# Patient Record
Sex: Male | Born: 1975 | Race: Black or African American | Hispanic: No | Marital: Married | Smoking: Former smoker
Health system: Southern US, Community
[De-identification: ages and names within clinical notes are randomized; demographics above are authoritative.]

## PROBLEM LIST (undated history)

## (undated) DIAGNOSIS — F32A Depression, unspecified: Secondary | ICD-10-CM

## (undated) DIAGNOSIS — K219 Gastro-esophageal reflux disease without esophagitis: Secondary | ICD-10-CM

## (undated) DIAGNOSIS — F419 Anxiety disorder, unspecified: Secondary | ICD-10-CM

## (undated) DIAGNOSIS — I10 Essential (primary) hypertension: Secondary | ICD-10-CM

## (undated) DIAGNOSIS — T1491XA Suicide attempt, initial encounter: Secondary | ICD-10-CM

## (undated) HISTORY — DX: Anxiety disorder, unspecified: F41.9

## (undated) HISTORY — DX: Depression, unspecified: F32.A

## (undated) HISTORY — DX: Gastro-esophageal reflux disease without esophagitis: K21.9

---

## 1999-05-15 ENCOUNTER — Emergency Department (HOSPITAL_COMMUNITY): Admission: EM | Admit: 1999-05-15 | Discharge: 1999-05-15 | Payer: Self-pay | Admitting: *Deleted

## 1999-10-29 ENCOUNTER — Emergency Department (HOSPITAL_COMMUNITY): Admission: EM | Admit: 1999-10-29 | Discharge: 1999-10-29 | Payer: Self-pay | Admitting: Emergency Medicine

## 2000-01-18 ENCOUNTER — Emergency Department (HOSPITAL_COMMUNITY): Admission: EM | Admit: 2000-01-18 | Discharge: 2000-01-19 | Payer: Self-pay | Admitting: Internal Medicine

## 2000-01-18 ENCOUNTER — Encounter: Payer: Self-pay | Admitting: Emergency Medicine

## 2000-06-22 ENCOUNTER — Emergency Department (HOSPITAL_COMMUNITY): Admission: EM | Admit: 2000-06-22 | Discharge: 2000-06-22 | Payer: Self-pay | Admitting: Emergency Medicine

## 2000-10-10 ENCOUNTER — Emergency Department (HOSPITAL_COMMUNITY): Admission: EM | Admit: 2000-10-10 | Discharge: 2000-10-10 | Payer: Self-pay | Admitting: Emergency Medicine

## 2001-01-10 ENCOUNTER — Emergency Department (HOSPITAL_COMMUNITY): Admission: EM | Admit: 2001-01-10 | Discharge: 2001-01-10 | Payer: Self-pay | Admitting: Emergency Medicine

## 2002-08-04 ENCOUNTER — Emergency Department (HOSPITAL_COMMUNITY): Admission: EM | Admit: 2002-08-04 | Discharge: 2002-08-04 | Payer: Self-pay | Admitting: Emergency Medicine

## 2002-08-04 ENCOUNTER — Encounter: Payer: Self-pay | Admitting: Emergency Medicine

## 2002-10-06 ENCOUNTER — Encounter: Payer: Self-pay | Admitting: Emergency Medicine

## 2002-10-06 ENCOUNTER — Emergency Department (HOSPITAL_COMMUNITY): Admission: EM | Admit: 2002-10-06 | Discharge: 2002-10-06 | Payer: Self-pay | Admitting: Emergency Medicine

## 2004-01-26 ENCOUNTER — Emergency Department (HOSPITAL_COMMUNITY): Admission: EM | Admit: 2004-01-26 | Discharge: 2004-01-26 | Payer: Self-pay | Admitting: *Deleted

## 2004-01-28 ENCOUNTER — Ambulatory Visit (HOSPITAL_COMMUNITY): Admission: RE | Admit: 2004-01-28 | Discharge: 2004-01-28 | Payer: Self-pay | Admitting: *Deleted

## 2004-01-30 ENCOUNTER — Emergency Department (HOSPITAL_COMMUNITY): Admission: EM | Admit: 2004-01-30 | Discharge: 2004-01-30 | Payer: Self-pay | Admitting: Emergency Medicine

## 2004-03-25 ENCOUNTER — Encounter: Admission: RE | Admit: 2004-03-25 | Discharge: 2004-03-25 | Payer: Self-pay | Admitting: Neurosurgery

## 2004-04-15 ENCOUNTER — Encounter: Admission: RE | Admit: 2004-04-15 | Discharge: 2004-04-15 | Payer: Self-pay | Admitting: Neurosurgery

## 2004-04-19 ENCOUNTER — Emergency Department (HOSPITAL_COMMUNITY): Admission: EM | Admit: 2004-04-19 | Discharge: 2004-04-19 | Payer: Self-pay | Admitting: Emergency Medicine

## 2006-03-14 ENCOUNTER — Emergency Department (HOSPITAL_COMMUNITY): Admission: EM | Admit: 2006-03-14 | Discharge: 2006-03-14 | Payer: Self-pay | Admitting: Emergency Medicine

## 2007-01-16 ENCOUNTER — Emergency Department (HOSPITAL_COMMUNITY): Admission: EM | Admit: 2007-01-16 | Discharge: 2007-01-16 | Payer: Self-pay | Admitting: *Deleted

## 2007-06-26 ENCOUNTER — Emergency Department (HOSPITAL_COMMUNITY): Admission: EM | Admit: 2007-06-26 | Discharge: 2007-06-26 | Payer: Self-pay | Admitting: Emergency Medicine

## 2008-04-20 ENCOUNTER — Emergency Department (HOSPITAL_COMMUNITY): Admission: EM | Admit: 2008-04-20 | Discharge: 2008-04-20 | Payer: Self-pay | Admitting: Emergency Medicine

## 2009-02-09 ENCOUNTER — Emergency Department (HOSPITAL_COMMUNITY): Admission: EM | Admit: 2009-02-09 | Discharge: 2009-02-09 | Payer: Self-pay | Admitting: Emergency Medicine

## 2009-02-21 ENCOUNTER — Emergency Department (HOSPITAL_COMMUNITY): Admission: EM | Admit: 2009-02-21 | Discharge: 2009-02-21 | Payer: Self-pay | Admitting: Emergency Medicine

## 2010-04-14 ENCOUNTER — Emergency Department (HOSPITAL_COMMUNITY)
Admission: EM | Admit: 2010-04-14 | Discharge: 2010-04-14 | Payer: Self-pay | Source: Home / Self Care | Admitting: Emergency Medicine

## 2010-04-17 ENCOUNTER — Emergency Department (HOSPITAL_COMMUNITY)
Admission: EM | Admit: 2010-04-17 | Discharge: 2010-04-17 | Payer: Self-pay | Source: Home / Self Care | Admitting: Emergency Medicine

## 2011-01-06 LAB — COMPREHENSIVE METABOLIC PANEL
ALT: 57 — ABNORMAL HIGH
AST: 30
Calcium: 9.6
GFR calc Af Amer: >60
Sodium: 140
Total Protein: 7.3

## 2011-01-06 LAB — RAPID URINE DRUG SCREEN, HOSP PERFORMED
Amphetamines: NOT DETECTED
Barbiturates: NOT DETECTED
Benzodiazepines: NOT DETECTED

## 2011-01-06 LAB — DIFFERENTIAL
Eosinophils Absolute: 0.3
Eosinophils Relative: 3
Lymphs Abs: 2.5
Monocytes Relative: 6

## 2011-01-06 LAB — URINALYSIS, ROUTINE W REFLEX MICROSCOPIC
Glucose, UA: NEGATIVE
Hgb urine dipstick: NEGATIVE
Ketones, ur: NEGATIVE
Protein, ur: NEGATIVE

## 2011-01-06 LAB — CBC
MCHC: 33.2
RBC: 4.96
RDW: 13

## 2012-06-23 ENCOUNTER — Emergency Department: Payer: Self-pay | Admitting: Emergency Medicine

## 2013-05-17 ENCOUNTER — Emergency Department: Payer: Self-pay | Admitting: Emergency Medicine

## 2013-06-10 ENCOUNTER — Emergency Department (HOSPITAL_COMMUNITY)
Admission: EM | Admit: 2013-06-10 | Discharge: 2013-06-10 | Disposition: A | Discharge status: Home / Self Care | Payer: Self-pay | Attending: Emergency Medicine | Admitting: Emergency Medicine

## 2013-06-10 ENCOUNTER — Encounter (HOSPITAL_COMMUNITY): Payer: Self-pay | Admitting: Emergency Medicine

## 2013-06-10 DIAGNOSIS — K089 Disorder of teeth and supporting structures, unspecified: Secondary | ICD-10-CM | POA: Insufficient documentation

## 2013-06-10 DIAGNOSIS — F17200 Nicotine dependence, unspecified, uncomplicated: Secondary | ICD-10-CM | POA: Insufficient documentation

## 2013-06-10 DIAGNOSIS — K0889 Other specified disorders of teeth and supporting structures: Secondary | ICD-10-CM

## 2013-06-10 DIAGNOSIS — G8921 Chronic pain due to trauma: Secondary | ICD-10-CM | POA: Insufficient documentation

## 2013-06-10 MED ORDER — OXYCODONE-ACETAMINOPHEN 5-325 MG PO TABS: 1.0000 | ORAL_TABLET | ORAL | Status: DC | PRN

## 2013-06-10 MED ORDER — OXYCODONE-ACETAMINOPHEN 5-325 MG PO TABS
2.0000 | ORAL_TABLET | Freq: Once | ORAL | Status: AC
  Administered 2013-06-10: 2 via ORAL
  Filled 2013-06-10: qty 2

## 2013-06-10 NOTE — ED Provider Notes (Signed)
Medical screening examination/treatment/procedure(s) were performed by non-physician practitioner and as supervising physician I was immediately available for consultation/collaboration.     Shaynah Hund, MD 06/10/13 1638 

## 2013-06-10 NOTE — Discharge Instructions (Signed)
Dental Pain °Toothache is pain in or around a tooth. It may get worse with chewing or with cold or heat.  °HOME CARE °· Your dentist may use a numbing medicine during treatment. If so, you may need to avoid eating until the medicine wears off. Ask your dentist about this. °· Only take medicine as told by your dentist or doctor. °· Avoid chewing food near the painful tooth until after all treatment is done. Ask your dentist about this. °GET HELP RIGHT AWAY IF:  °· The problem gets worse or new problems appear. °· You have a fever. °· There is redness and puffiness (swelling) of the face, jaw, or neck. °· You cannot open your mouth. °· There is pain in the jaw. °· There is very bad pain that is not helped by medicine. °MAKE SURE YOU:  °· Understand these instructions. °· Will watch your condition. °· Will get help right away if you are not doing well or get worse. °Document Released: 09/01/2007 Document Revised: 06/07/2011 Document Reviewed: 09/01/2007 °ExitCare® Patient Information ©2014 [employer]. ° °Dental Care and Dentist Visits °Dental care supports good overall health. Regular dental visits can also help you avoid dental pain, bleeding, infection, and other more serious health problems in the future. It is important to keep the mouth healthy because diseases in the teeth, gums, and other oral tissues can spread to other areas of the body. Some problems, such as diabetes, heart disease, and pre-term labor have been associated with poor oral health.  °See your dentist every 6 months. If you experience emergency problems such as a toothache or broken tooth, go to the dentist right away. If you see your dentist regularly, you may catch problems early. It is easier to be treated for problems in the early stages.  °WHAT TO EXPECT AT A DENTIST VISIT  °Your dentist will look for many common oral health problems and recommend proper treatment. At your regular dental visit, you can expect: °· Gentle cleaning of the  teeth and gums. This includes scraping and polishing. This helps to remove the sticky substance around the teeth and gums (plaque). Plaque forms in the mouth shortly after eating. Over time, plaque hardens on the teeth as tartar. If tartar is not removed regularly, it can cause problems. Cleaning also helps remove stains. °· Periodic X-rays. These pictures of the teeth and supporting bone will help your dentist assess the health of your teeth. °· Periodic fluoride treatments. Fluoride is a natural mineral shown to help strengthen teeth. Fluoride treatment involves applying a fluoride gel or varnish to the teeth. It is most commonly done in children. °· Examination of the mouth, tongue, jaws, teeth, and gums to look for any oral health problems, such as: °· Cavities (dental caries). This is decay on the tooth caused by plaque, sugar, and acid in the mouth. It is best to catch a cavity when it is small. °· Inflammation of the gums caused by plaque buildup (gingivitis). °· Problems with the mouth or malformed or misaligned teeth. °· Oral cancer or other diseases of the soft tissues or jaws.  °KEEP YOUR TEETH AND GUMS HEALTHY °For healthy teeth and gums, follow these general guidelines as well as your dentist's specific advice: °· Have your teeth professionally cleaned at the dentist every 6 months. °· Brush twice daily with a fluoride toothpaste. °· Floss your teeth daily.  °· Ask your dentist if you need fluoride supplements, treatments, or fluoride toothpaste. °· Eat a healthy diet. Reduce foods and drinks   with added sugar. °· Avoid smoking. °TREATMENT FOR ORAL HEALTH PROBLEMS °If you have oral health problems, treatment varies depending on the conditions present in your teeth and gums. °· Your caregiver will most likely recommend good oral hygiene at each visit. °· For cavities, gingivitis, or other oral health disease, your caregiver will perform a procedure to treat the problem. This is typically done at a  separate appointment. Sometimes your caregiver will refer you to another dental specialist for specific tooth problems or for surgery. °SEEK IMMEDIATE DENTAL CARE IF: °· You have pain, bleeding, or soreness in the gum, tooth, jaw, or mouth area. °· A permanent tooth becomes loose or separated from the gum socket. °· You experience a blow or injury to the mouth or jaw area. °Document Released: 11/25/2010 Document Revised: 06/07/2011 Document Reviewed: 11/25/2010 °ExitCare® Patient Information ©2014 [employer]. ° °

## 2013-06-10 NOTE — ED Notes (Signed)
Pt reports having multiple missing and loose teeth. Having pain to right side and now swelling to face. Airway intact.

## 2013-06-10 NOTE — ED Notes (Signed)
Onset 1 month upper right teeth pain.  Pt states upper right wisdom tooth and several moore teeth are loose and roots exposed.  [place] and was prescribed ABX and Vicodin.  Finished ABX 1 week ago.  Has been taking OTC meds with no relief.  Pain last night was unbearable.
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
## 2014-02-03 ENCOUNTER — Encounter (HOSPITAL_COMMUNITY): Payer: Self-pay | Admitting: Emergency Medicine

## 2014-02-03 ENCOUNTER — Emergency Department (HOSPITAL_COMMUNITY)
Admission: EM | Admit: 2014-02-03 | Discharge: 2014-02-04 | Disposition: A | Discharge status: Home / Self Care | Payer: Self-pay | Attending: Emergency Medicine | Admitting: Emergency Medicine

## 2014-02-03 DIAGNOSIS — R45851 Suicidal ideations: Secondary | ICD-10-CM

## 2014-02-03 DIAGNOSIS — F439 Reaction to severe stress, unspecified: Secondary | ICD-10-CM | POA: Diagnosis present

## 2014-02-03 DIAGNOSIS — Z720 Tobacco use: Secondary | ICD-10-CM | POA: Insufficient documentation

## 2014-02-03 DIAGNOSIS — F32A Depression, unspecified: Secondary | ICD-10-CM | POA: Diagnosis present

## 2014-02-03 DIAGNOSIS — F1410 Cocaine abuse, uncomplicated: Secondary | ICD-10-CM | POA: Diagnosis present

## 2014-02-03 DIAGNOSIS — F1210 Cannabis abuse, uncomplicated: Secondary | ICD-10-CM | POA: Insufficient documentation

## 2014-02-03 DIAGNOSIS — F329 Major depressive disorder, single episode, unspecified: Secondary | ICD-10-CM | POA: Diagnosis present

## 2014-02-03 DIAGNOSIS — F438 Other reactions to severe stress: Secondary | ICD-10-CM | POA: Insufficient documentation

## 2014-02-03 DIAGNOSIS — F1010 Alcohol abuse, uncomplicated: Secondary | ICD-10-CM | POA: Diagnosis present

## 2014-02-03 LAB — SALICYLATE LEVEL: Salicylate Lvl: <2 mg/dL — ABNORMAL LOW (ref 2.8–20.0)

## 2014-02-03 LAB — COMPREHENSIVE METABOLIC PANEL
ALK PHOS: 73 U/L (ref 39–117)
ALT: 48 U/L (ref 0–53)
ANION GAP: 18 — AB (ref 5–15)
AST: 38 U/L — ABNORMAL HIGH (ref 0–37)
Albumin: 4.7 g/dL (ref 3.5–5.2)
BUN: 11 mg/dL (ref 6–23)
CALCIUM: 10.4 mg/dL (ref 8.4–10.5)
CO2: 23 meq/L (ref 19–32)
Chloride: 99 mEq/L (ref 96–112)
Creatinine, Ser: 0.91 mg/dL (ref 0.50–1.35)
GLUCOSE: 87 mg/dL (ref 70–99)
POTASSIUM: 4 meq/L (ref 3.7–5.3)
SODIUM: 140 meq/L (ref 137–147)
Total Bilirubin: 0.3 mg/dL (ref 0.3–1.2)
Total Protein: 9.1 g/dL — ABNORMAL HIGH (ref 6.0–8.3)

## 2014-02-03 LAB — ACETAMINOPHEN LEVEL: Acetaminophen (Tylenol), Serum: <15 ug/mL (ref 10–30)

## 2014-02-03 LAB — RAPID URINE DRUG SCREEN, HOSP PERFORMED
AMPHETAMINES: NOT DETECTED
BARBITURATES: NOT DETECTED
Benzodiazepines: NOT DETECTED
Cocaine: POSITIVE — AB
Opiates: NOT DETECTED
TETRAHYDROCANNABINOL: POSITIVE — AB

## 2014-02-03 LAB — CBC
HEMATOCRIT: 44.6 % (ref 39.0–52.0)
HEMOGLOBIN: 14.9 g/dL (ref 13.0–17.0)
MCH: 29.2 pg (ref 26.0–34.0)
MCHC: 33.4 g/dL (ref 30.0–36.0)
MCV: 87.5 fL (ref 78.0–100.0)
Platelets: 262 10*3/uL (ref 150–400)
RBC: 5.1 MIL/uL (ref 4.22–5.81)
RDW: 13.3 % (ref 11.5–15.5)
WBC: 8.9 10*3/uL (ref 4.0–10.5)

## 2014-02-03 LAB — ETHANOL: Alcohol, Ethyl (B): 23 mg/dL — ABNORMAL HIGH (ref 0–11)

## 2014-02-03 MED ORDER — ACETAMINOPHEN 325 MG PO TABS: 650.0000 mg | ORAL_TABLET | ORAL | Status: DC | PRN

## 2014-02-03 MED ORDER — ALUM & MAG HYDROXIDE-SIMETH 200-200-20 MG/5ML PO SUSP: 30.0000 mL | ORAL | Status: DC | PRN

## 2014-02-03 MED ORDER — LORAZEPAM 1 MG PO TABS
1.0000 mg | ORAL_TABLET | Freq: Three times a day (TID) | ORAL | Status: DC | PRN
  Administered 2014-02-03: 1 mg via ORAL
  Filled 2014-02-03: qty 1

## 2014-02-03 MED ORDER — IBUPROFEN 200 MG PO TABS: 600.0000 mg | ORAL_TABLET | Freq: Three times a day (TID) | ORAL | Status: DC | PRN

## 2014-02-03 MED ORDER — TRAZODONE HCL 50 MG PO TABS
50.0000 mg | ORAL_TABLET | Freq: Every evening | ORAL | Status: DC | PRN
  Administered 2014-02-03: 50 mg via ORAL
  Filled 2014-02-03: qty 1

## 2014-02-03 NOTE — BH Assessment (Addendum)
Pt meets inpatient criteria, per Maryjean Mornharles Kober PA. TTS will seek placement for pt since [facility]. TTS informed Dr Nicanor AlconPalumbo of recommendations and she is in agreement. Informed RN of plan to seek placement. Informed pt of plan and he is in agreement.  Cyndie MullAnna Jager Koska, [facility] Triage Specialist

## 2014-02-03 NOTE — ED Notes (Signed)
Pt arrived to the ED with the complaint of suicidal ideations.  Pt states he has been having several different issues of concern in his life and feels as if there is no reason left.  Pt is voluntary and would like someone to talk to about his situation

## 2014-02-03 NOTE — ED Provider Notes (Signed)
CSN: [id number]     Arrival date & time 02/03/14  0259 History   First MD Initiated Contact with Patient 02/03/14 0351     Chief Complaint  Patient presents with  . Suicidal     (Consider location/radiation/quality/duration/timing/severity/associated sxs/prior Treatment) Patient is a 38 y.o. male presenting with mental health disorder. The history is provided by the patient.  Mental Health Problem Presenting symptoms: depression and suicidal thoughts   Presenting symptoms: no suicide attempt   Patient accompanied by: no one. Degree of incapacity (severity):  Severe Onset quality:  Gradual Timing:  Constant Progression:  Worsening Chronicity:  New Context: alcohol use and drug abuse   Treatment compliance:  Untreated Relieved by:  Nothing Ineffective treatments:  None tried Associated symptoms: no abdominal pain   Risk factors: no family violence     History reviewed. No pertinent past medical history. History reviewed. No pertinent past surgical history. History reviewed. No pertinent family history. History  Substance Use Topics  . Smoking status: Current Every Day Smoker    Types: Cigarettes  . Smokeless tobacco: Not on file  . Alcohol Use: No    Review of Systems  Gastrointestinal: Negative for abdominal pain.  Psychiatric/Behavioral: Positive for suicidal ideas.  All other systems reviewed and are negative.     Allergies  Review of patient's allergies indicates no known allergies.  Home Medications   Prior to Admission medications   Medication Sig Start Date End Date Taking? Authorizing Provider  oxyCODONE-acetaminophen (PERCOCET/ROXICET) 5-325 MG per tablet Take 1-2 tablets by mouth every 4 (four) hours as needed for severe pain. 06/10/13   Shari A Upstill, PA-C   BP 163/107 mmHg  Pulse 85  Temp(Src) 97.8 F (36.6 C) (Oral)  Resp 18  SpO2 94% Physical Exam  Constitutional: He is oriented to person, place, and time. He appears well-developed and  well-nourished. No distress.  HENT:  Head: Normocephalic and atraumatic.  Mouth/Throat: Oropharynx is clear and moist.  Eyes: Conjunctivae are normal. Pupils are equal, round, and reactive to light.  Neck: Normal range of motion. Neck supple.  Cardiovascular: Normal rate, regular rhythm and intact distal pulses.   Pulmonary/Chest: Effort normal and breath sounds normal. He has no wheezes. He has no rales.  Abdominal: Soft. Bowel sounds are normal. There is no tenderness. There is no rebound and no guarding.  Musculoskeletal: Normal range of motion.  Neurological: He is alert and oriented to person, place, and time.  Skin: Skin is warm and dry.  Psychiatric: His speech is not rapid and/or pressured. He exhibits a depressed mood. He expresses suicidal ideation. He expresses suicidal plans.    ED Course  Procedures (including critical care time) Labs Review Labs Reviewed  ACETAMINOPHEN LEVEL  CBC  COMPREHENSIVE METABOLIC PANEL  ETHANOL  SALICYLATE LEVEL  URINE RAPID DRUG SCREEN (HOSP PERFORMED)    Imaging Review No results found.   EKG Interpretation None      MDM   Final diagnoses:  None    To be seen by tts    Islah Eve K Mikhala Kenan-Rasch, MD 02/03/14 [id number]

## 2014-02-03 NOTE — ED Notes (Signed)
Bed: [facility]35 Expected date:  Expected time:  Means of arrival:  Comments: TR 4 

## 2014-02-03 NOTE — ED Notes (Signed)
Patient presents depressed with affect congruent; continues to endorse feelings of hopelessness and helplessness; states that he is receiving phone calls which makes him feel less depressed; denies SI/HI/AVH. NAD
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
## 2014-02-04 ENCOUNTER — Encounter (HOSPITAL_COMMUNITY): Payer: Self-pay | Admitting: Psychiatry

## 2014-02-04 DIAGNOSIS — F1410 Cocaine abuse, uncomplicated: Secondary | ICD-10-CM | POA: Diagnosis present

## 2014-02-04 DIAGNOSIS — R45851 Suicidal ideations: Secondary | ICD-10-CM | POA: Insufficient documentation

## 2014-02-04 DIAGNOSIS — F1010 Alcohol abuse, uncomplicated: Secondary | ICD-10-CM | POA: Diagnosis present

## 2014-02-04 DIAGNOSIS — F430 Acute stress reaction: Secondary | ICD-10-CM

## 2014-02-04 DIAGNOSIS — F439 Reaction to severe stress, unspecified: Secondary | ICD-10-CM | POA: Diagnosis present
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
## 2014-02-04 NOTE — BH Assessment (Signed)
Discharge home per Dr. Jannifer FranklinAkintayo and Nanine MeansJamison Lord, NP. Patient referred to the following facility for follow up outpatient services:   [facility]  [address]  [place], [place] [address]  Phone: [phone]  Fax: [phone]  [employer]: [phone] [employer]: [phone]: [phone]

## 2014-02-04 NOTE — [facility] Suicide Risk Assessment (Signed)
Suicide Risk Assessment  Discharge Assessment     Demographic Factors:  Male  Total Time spent with patient: 45 minutes  Psychiatric Specialty Exam:     Blood pressure 126/76, pulse 64, temperature 98.3 F (36.8 C), temperature source Oral, resp. rate 16, SpO2 98 %.There is no height or weight on file to calculate BMI.  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 Leisure Time Physical Health Resilience Social Support  Sleep:       Musculoskeletal: Strength & Muscle Tone: within normal limits Gait & Station: normal Patient leans: N/A   Mental Status Per Nursing Assessment::   On Admission:   alcohol and cocaine use with suicidal/homicidal ideatoins  Current Mental Status by Physician: NA  Loss Factors: NA  Historical Factors: NA  Risk Reduction Factors:   Sense of responsibility to family, Living with another person, especially a relative and Positive social support  Continued Clinical Symptoms:  Irritability   Cognitive Features That Contribute To Risk:  None   Suicide Risk:  Minimal: No identifiable suicidal ideation.  Patients presenting with no risk factors but with morbid ruminations; may be classified as minimal risk based on the severity of the depressive symptoms  Discharge Diagnoses:   AXIS I:  Emotional stress reaction; cocaine abuse; alcohol abuse AXIS II:  Deferred AXIS III:  History reviewed. No pertinent past  medical history. AXIS IV:  other psychosocial or environmental problems and problems related to social environment AXIS V:  61-70 mild symptoms  Plan Of Care/Follow-up recommendations:  Activity:  as tolerated Diet:  heart healthy diet  Is patient on multiple antipsychotic therapies at discharge:  No   Has Patient had three or more failed trials of antipsychotic monotherapy by history:  No  Recommended Plan for Multiple Antipsychotic Therapies: NA    Francyne Arreaga, PMH-NP 02/04/2014, 12:55 PM

## 2014-02-04 NOTE — ED Notes (Signed)
Patient discharged with bus pass to home. Patient denies pain at this time. Patient was given resources and AVS. No distress noted upon discharge. Patient was escorted to front by MHT. Q15 minute safety checks maintained until discharge.

## 2014-03-29 DIAGNOSIS — T1491XA Suicide attempt, initial encounter: Secondary | ICD-10-CM

## 2014-03-29 HISTORY — DX: Suicide attempt, initial encounter: T14.91XA

## 2016-04-17 ENCOUNTER — Emergency Department (HOSPITAL_COMMUNITY)
Admission: EM | Admit: 2016-04-17 | Discharge: 2016-04-18 | Disposition: A | Discharge status: Home / Self Care | Payer: Self-pay | Attending: Emergency Medicine | Admitting: Emergency Medicine

## 2016-04-17 ENCOUNTER — Encounter (HOSPITAL_COMMUNITY): Payer: Self-pay | Admitting: Emergency Medicine

## 2016-04-17 DIAGNOSIS — F329 Major depressive disorder, single episode, unspecified: Secondary | ICD-10-CM | POA: Insufficient documentation

## 2016-04-17 DIAGNOSIS — F17210 Nicotine dependence, cigarettes, uncomplicated: Secondary | ICD-10-CM | POA: Insufficient documentation

## 2016-04-17 DIAGNOSIS — Z79899 Other long term (current) drug therapy: Secondary | ICD-10-CM | POA: Insufficient documentation

## 2016-04-17 DIAGNOSIS — R45851 Suicidal ideations: Secondary | ICD-10-CM

## 2016-04-17 HISTORY — DX: Suicide attempt, initial encounter: T14.91XA

## 2016-04-17 LAB — COMPREHENSIVE METABOLIC PANEL
ALT: 53 U/L (ref 17–63)
ANION GAP: 14 (ref 5–15)
AST: 30 U/L (ref 15–41)
Albumin: 4.9 g/dL (ref 3.5–5.0)
Alkaline Phosphatase: 73 U/L (ref 38–126)
BUN: 11 mg/dL (ref 6–20)
CALCIUM: 10.3 mg/dL (ref 8.9–10.3)
CO2: 25 mmol/L (ref 22–32)
Chloride: 99 mmol/L — ABNORMAL LOW (ref 101–111)
Creatinine, Ser: 1.06 mg/dL (ref 0.61–1.24)
GFR calc Af Amer: >60 mL/min (ref >60)
GLUCOSE: 105 mg/dL — AB (ref 65–99)
POTASSIUM: 3.1 mmol/L — AB (ref 3.5–5.1)
Sodium: 138 mmol/L (ref 135–145)
Total Bilirubin: 0.7 mg/dL (ref 0.3–1.2)
Total Protein: 8.9 g/dL — ABNORMAL HIGH (ref 6.5–8.1)

## 2016-04-17 LAB — RAPID URINE DRUG SCREEN, HOSP PERFORMED
Amphetamines: NOT DETECTED
BARBITURATES: NOT DETECTED
BENZODIAZEPINES: NOT DETECTED
Cocaine: POSITIVE — AB
Opiates: NOT DETECTED
Tetrahydrocannabinol: NOT DETECTED

## 2016-04-17 LAB — CBC
HEMATOCRIT: 46 % (ref 39.0–52.0)
Hemoglobin: 15.5 g/dL (ref 13.0–17.0)
MCH: 30.3 pg (ref 26.0–34.0)
MCHC: 33.7 g/dL (ref 30.0–36.0)
MCV: 89.8 fL (ref 78.0–100.0)
PLATELETS: 301 10*3/uL (ref 150–400)
RBC: 5.12 MIL/uL (ref 4.22–5.81)
RDW: 13.4 % (ref 11.5–15.5)
WBC: 10.4 10*3/uL (ref 4.0–10.5)

## 2016-04-17 LAB — SALICYLATE LEVEL: Salicylate Lvl: <7 mg/dL (ref 2.8–30.0)

## 2016-04-17 LAB — ACETAMINOPHEN LEVEL: Acetaminophen (Tylenol), Serum: <10 ug/mL — ABNORMAL LOW (ref 10–30)

## 2016-04-17 LAB — ETHANOL: Alcohol, Ethyl (B): 92 mg/dL — ABNORMAL HIGH (ref <5)

## 2016-04-17 MED ORDER — POTASSIUM CHLORIDE CRYS ER 20 MEQ PO TBCR
40.0000 meq | EXTENDED_RELEASE_TABLET | Freq: Once | ORAL | Status: AC
  Administered 2016-04-17: 40 meq via ORAL
  Filled 2016-04-17: qty 2

## 2016-04-17 NOTE — ED Notes (Signed)
Breakfast tray ordered 

## 2016-04-17 NOTE — ED Notes (Signed)
Ginger Ale given as requested for snack - states did not want food at this time. Dinner order received.

## 2016-04-17 NOTE — ED Notes (Signed)
Dr Judd Lienelo in w/pt.

## 2016-04-17 NOTE — ED Notes (Signed)
Dr Judd Lienelo aware pt roomed and needs MSE.

## 2016-04-17 NOTE — ED Notes (Signed)
Pt offered snack and refreshment. Pt calm, awake, and watching television.

## 2016-04-17 NOTE — Progress Notes (Signed)
Patient has been referred for inpatient treatment at the following facilities: [facility], [facility], [facility], [facility].  Writer left [facility]icemail for [facility] inquiring of bed status.  [facility] [address] [facility], [facility], [facility], and [facility].  CSW will continue to seek placement and follow up.  Melbourne Abtsatia Charm Stenner, LCSWA Disposition staff 04/17/2016 11:04 AM

## 2016-04-17 NOTE — ED Notes (Addendum)
Pharm Tech in w/pt. Advised pt requesting his cell phone after 1400 so may obtain phone number to family member so may call to obtain name of meds.

## 2016-04-17 NOTE — ED Notes (Addendum)
Obtained pt's cell phone from security. Pt stated he had the phone numbers he needs listed on a piece of paper in his pocket. RN returned phone to [employer]. Pt at nurses' desk attempting to call family member so may obtain names of meds for pharmacy tech. Pt then asking for cell phone back so may obtain another number. Requested for pt to try the numbers he has in his pocket first. Voiced understanding.

## 2016-04-17 NOTE — ED Notes (Signed)
After pt finished call, states he needs to obtain his sister's boyfriend's number d/t he is at the location where pt has his belongings and meds. Advised pt will obtain phone when security returns to their office later. Voiced understanding and returned to room.

## 2016-04-17 NOTE — ED Triage Notes (Signed)
Patients reports suicidal ideation , he did not disclose his plan of suicide , denies hallucinations .

## 2016-04-17 NOTE — ED Notes (Signed)
Pt received cellphone and obtained medication list. [facility]. Cellphone returned back to security.

## 2016-04-17 NOTE — ED Notes (Signed)
Pt had cell phone w/him while in triage. Pt gave to RN - placed in pt's labeled belongings bag so may be inventoried. 2 labeled belongings bags placed at nurses' station.
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
## 2016-04-17 NOTE — ED Notes (Signed)
Pt. recieve a spoon for his peanut butter

## 2016-04-17 NOTE — ED Notes (Signed)
Pt aware of tx plan - seeking inpt.

## 2016-04-17 NOTE — ED Notes (Signed)
Pt signed Medical Clearance Pt policy paper and voiced understanding.

## 2016-04-17 NOTE — ED Notes (Signed)
TTS being performed.  

## 2016-04-17 NOTE — ED Notes (Signed)
States was inpt in [place] x 2 years ago after his daughter went missing d/t attempted to OD on pills.

## 2016-04-17 NOTE — ED Notes (Signed)
Pt noted to be upset after TTS performed. States he wants to leave d/t counselor upset him. States she asked what type of treatment he needed - states if he knew, he wouldn't be here. Encouraged pt to vent concerns and requested for him to wait until after assessment decision has been made. Voiced understanding and returned to lying on bed.

## 2016-04-17 NOTE — ED Provider Notes (Signed)
[facility]-EMERGENCY DEPT Provider Note   CSN: [id number] Arrival date & time: 04/17/16  0011     History   Chief Complaint Chief Complaint  Patient presents with  . Suicidal    HPI GLENN GULLICKSON is a 41 y.o. male.  Patient is a 41 year old male with past medical history of depression, alcohol abuse. He presents for evaluation of suicidal ideation. Patient reports it is the 2 year anniversary of the disappearance of his daughter and this is been quite stressful for him. He states he was out yesterday evening consuming alcohol when he began to feel depressed and suicidal. He presents for help for this. He reports a prior suicide attempt in [place] and subsequent hospitalization. He tells me he is feeling similar to how he felt before he overdosed on prescription medication.      Past Medical History:  Diagnosis Date  . Suicide attempt 01/2014   ingested pills    Patient Active Problem List   Diagnosis Date Noted  . Alcohol abuse 02/04/2014  . Cocaine abuse 02/04/2014  . Emotional stress reaction 02/04/2014  . Suicidal ideation   . Major depression 02/03/2014    History reviewed. No pertinent surgical history.     Home Medications    Prior to Admission medications   Medication Sig Start Date End Date Taking? Authorizing Provider  oxyCODONE-acetaminophen (PERCOCET/ROXICET) 5-325 MG per tablet Take 1-2 tablets by mouth every 4 (four) hours as needed for severe pain. 06/10/13   Elpidio Anis, PA-C    Family History No family history on file.  Social History Social History  Substance Use Topics  . Smoking status: Current Every Day Smoker    Types: Cigarettes  . Smokeless tobacco: Never Used  . Alcohol use Yes     Allergies   Patient has no known allergies.   Review of Systems Review of Systems  All other systems reviewed and are negative.    Physical Exam Updated Vital Signs BP 158/92   Pulse 86   Temp 98.3 F (36.8 C) (Oral)   Resp 14   Ht 6\' 1"   (1.854 m)   Wt 200 lb (90.7 kg)   SpO2 97%   BMI 26.39 kg/m   Physical Exam  Constitutional: He is oriented to person, place, and time. He appears well-developed and well-nourished. No distress.  HENT:  Head: Normocephalic and atraumatic.  Mouth/Throat: Oropharynx is clear and moist.  Neck: Normal range of motion. Neck supple.  Cardiovascular: Normal rate and regular rhythm.  Exam reveals no friction rub.   No murmur heard. Pulmonary/Chest: Effort normal and breath sounds normal. No respiratory distress. He has no wheezes. He has no rales.  Abdominal: Soft. Bowel sounds are normal. He exhibits no distension. There is no tenderness.  Musculoskeletal: Normal range of motion. He exhibits no edema.  Neurological: He is alert and oriented to person, place, and time. Coordination normal.  Skin: Skin is warm and dry. He is not diaphoretic.  Psychiatric: His speech is normal. He is withdrawn. Cognition and memory are normal. He expresses impulsivity. He exhibits a depressed mood. He expresses suicidal ideation. He expresses no suicidal plans.  Nursing note and vitals reviewed.    ED Treatments / Results  Labs (all labs ordered are listed, but only abnormal results are displayed) Labs Reviewed  COMPREHENSIVE METABOLIC PANEL - Abnormal; Notable for the following:       Result Value   Potassium 3.1 (*)    Chloride 99 (*)    Glucose, Bld  105 (*)    Total Protein 8.9 (*)    All other components within normal limits  ETHANOL - Abnormal; Notable for the following:    Alcohol, Ethyl (B) 92 (*)    All other components within normal limits  ACETAMINOPHEN LEVEL - Abnormal; Notable for the following:    Acetaminophen (Tylenol), Serum <10 (*)    All other components within normal limits  RAPID URINE DRUG SCREEN, HOSP PERFORMED - Abnormal; Notable for the following:    Cocaine POSITIVE (*)    All other components within normal limits  SALICYLATE LEVEL  CBC    EKG  EKG  Interpretation None       Radiology No results found.  Procedures Procedures (including critical care time)  Medications Ordered in ED Medications - No data to display   Initial Impression / Assessment and Plan / ED Course  I have reviewed the triage vital signs and the nursing notes.  Pertinent labs & imaging results that were available during my care of the patient were reviewed by me and considered in my medical decision making (see chart for details).     Per TTS, patient meets inpatient criteria. A bed search is underway.  Final Clinical Impressions(s) / ED Diagnoses   Final diagnoses:  None    New Prescriptions New Prescriptions   No medications on file     Geoffery Lyonsouglas James Senn, MD 04/17/16 1520

## 2016-04-17 NOTE — ED Notes (Signed)
Charge nurse and staffing office notified for pt.'s sitter , purple scrubs given to pt. , security notified to wand pt.  

## 2016-04-18 MED ORDER — HYDROXYZINE PAMOATE 50 MG PO CAPS
50.0000 mg | ORAL_CAPSULE | Freq: Every day | ORAL | Status: DC
  Filled 2016-04-18: qty 1

## 2016-04-18 MED ORDER — RISPERIDONE 1 MG PO TABS
1.0000 mg | ORAL_TABLET | Freq: Two times a day (BID) | ORAL | Status: DC
  Filled 2016-04-18: qty 1

## 2016-04-18 MED ORDER — CITALOPRAM HYDROBROMIDE 10 MG PO TABS
20.0000 mg | ORAL_TABLET | Freq: Every day | ORAL | Status: DC
  Filled 2016-04-18: qty 2

## 2016-04-18 NOTE — ED Notes (Signed)
Olegario MessierKathy [phone]- [phone] - called requesting to speak w/pt. Advised her will notify pt of her call when he awakens.

## 2016-04-18 NOTE — ED Notes (Signed)
Pt continue to sleep. Respirations even, unlabored. Pt has been moving about on bed intermittently.

## 2016-04-18 NOTE — ED Notes (Signed)
Pt called sister's boyfriend for ride from ED. Advised he will be here in approx 30 minutes.

## 2016-04-18 NOTE — ED Notes (Signed)
Re-TTS being performed.  

## 2016-04-18 NOTE — ED Notes (Signed)
Ordered pt's dinner requested via phone. Pt lying on bed watching tv.

## 2016-04-18 NOTE — ED Notes (Signed)
Pt ambulatory to F9 - wearing burgundy scrubs. Pt's belongings obtained from desk in [facility] and placed in lower cabinet in room - locked - verified w/sitter. 2 labeled belongings bags - valuables remain w/security. Pt eating breakfast. Orange juice x 2 given as requested.

## 2016-04-18 NOTE — ED Notes (Signed)
Will administer 1000 meds when pt awakens.  

## 2016-04-18 NOTE — ED Notes (Signed)
Pt sleeping. Lunch tray on bedside table.

## 2016-04-18 NOTE — ED Notes (Signed)
Pt signed NO HARM SAFETY CONTRACT - voiced understanding - copy given.

## 2016-04-18 NOTE — [facility] Counselor (Signed)
Re-assessment- Pt denies SI/HI. Pt denies AVH. Pt states he feels that his alcohol consumption caused him to feel suicidal.  Pt states he is going to reside with his sister when he is released and she has been a support for him. Pt states [facility].   Per Jacki ConesLaurie, NP Pt can be D/C.  Recommendations provided to Dr. Argentina DonovanPluckett.  Wolfgang PhoenixBrandi Aika Brzoska, [facility] Triage Specialist

## 2016-04-18 NOTE — ED Notes (Addendum)
Pt given shower supplies as requested. Pt asking to speak w/[facility] counselor d/t states feels better after sleeping and ETOH is out of his system. States feels he can think more clearly and would like to be d/c'd to home. Advised [facility].

## 2016-04-18 NOTE — ED Notes (Signed)
Per Merry ProudBrandi, [facility], pt may be d/c'd to home - is to advise EDP.

## 2016-04-18 NOTE — ED Notes (Signed)
Pt on phone at nurses' desk talking w/his sister.

## 2016-04-18 NOTE — Progress Notes (Signed)
Referral has been followed up at: [facility] - per Ron, referral not found, refax it.  [facility] - per [employer]onte, refax, 2 adult male beds open, and 2 adolescent beds open as well. [facility] - [facility]icemail [facility]  - per ShubutaNicole, referral to be refaxed.  [facility] and writer will continue to follow up with bed openings here.  Melbourne Abtsatia Tarisa Paola, LCSWA Social worker in disposition, [facility] [phone] 04/18/2016 10:41 AM

## 2016-04-18 NOTE — ED Notes (Signed)
Woke pt so may eat lunch. Advised pt Danny MessierKathy called and wants him to call her back. Pt aware re-TTS to be performed.

## 2016-04-18 NOTE — ED Notes (Signed)
Pt on phone talking w/his spouse.

## 2018-01-27 ENCOUNTER — Ambulatory Visit: Payer: Self-pay | Admitting: Internal Medicine

## 2018-10-25 ENCOUNTER — Other Ambulatory Visit: Payer: Self-pay

## 2018-10-25 ENCOUNTER — Emergency Department (HOSPITAL_COMMUNITY): Admission: EM | Admit: 2018-10-25 | Discharge: 2018-10-25 | Discharge status: Absent without leave | Payer: Self-pay

## 2019-03-03 ENCOUNTER — Emergency Department (HOSPITAL_COMMUNITY)
Admission: EM | Admit: 2019-03-03 | Discharge: 2019-03-04 | Disposition: A | Discharge status: Home / Self Care | Payer: Self-pay | Attending: Emergency Medicine | Admitting: Emergency Medicine

## 2019-03-03 ENCOUNTER — Encounter (HOSPITAL_COMMUNITY): Payer: Self-pay

## 2019-03-03 ENCOUNTER — Emergency Department (HOSPITAL_COMMUNITY): Payer: Self-pay

## 2019-03-03 ENCOUNTER — Other Ambulatory Visit: Payer: Self-pay

## 2019-03-03 DIAGNOSIS — R200 Anesthesia of skin: Secondary | ICD-10-CM | POA: Insufficient documentation

## 2019-03-03 DIAGNOSIS — Z23 Encounter for immunization: Secondary | ICD-10-CM | POA: Insufficient documentation

## 2019-03-03 DIAGNOSIS — F17210 Nicotine dependence, cigarettes, uncomplicated: Secondary | ICD-10-CM | POA: Insufficient documentation

## 2019-03-03 DIAGNOSIS — Z1889 Other specified retained foreign body fragments: Secondary | ICD-10-CM | POA: Insufficient documentation

## 2019-03-03 MED ORDER — TETANUS-DIPHTH-ACELL PERTUSSIS 5-2.5-18.5 LF-MCG/0.5 IM SUSP
0.5000 mL | Freq: Once | INTRAMUSCULAR | Status: AC
Start: 2019-03-03 — End: 2019-03-03
  Administered 2019-03-03: 0.5 mL via INTRAMUSCULAR
  Filled 2019-03-03: qty 0.5

## 2019-03-03 NOTE — Discharge Instructions (Signed)
You will need to call hand surgery to make an appointment for follow up in regards to your symptoms today.   Please return to the emergency department for any new or worsening symptoms.

## 2019-03-03 NOTE — ED Notes (Signed)
Patient transported to X-ray 
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
## 2019-03-04 NOTE — ED Notes (Signed)
Patient verbalizes understanding of discharge instructions. Opportunity for questioning and answers were provided. Armband removed by staff, pt discharged from ED ambulatory.   

## 2020-12-07 ENCOUNTER — Emergency Department (HOSPITAL_COMMUNITY)
Admission: EM | Admit: 2020-12-07 | Discharge: 2020-12-07 | Disposition: A | Discharge status: Home / Self Care | Payer: Self-pay | Attending: Student | Admitting: Student

## 2020-12-07 ENCOUNTER — Encounter (HOSPITAL_COMMUNITY): Payer: Self-pay | Admitting: Emergency Medicine

## 2020-12-07 ENCOUNTER — Emergency Department (HOSPITAL_COMMUNITY): Payer: Self-pay

## 2020-12-07 ENCOUNTER — Other Ambulatory Visit: Payer: Self-pay

## 2020-12-07 DIAGNOSIS — M542 Cervicalgia: Secondary | ICD-10-CM | POA: Insufficient documentation

## 2020-12-07 DIAGNOSIS — Z5321 Procedure and treatment not carried out due to patient leaving prior to being seen by health care provider: Secondary | ICD-10-CM | POA: Insufficient documentation

## 2020-12-07 HISTORY — DX: Essential (primary) hypertension: I10
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
## 2020-12-07 NOTE — ED Notes (Signed)
Called pt x3 for room, no response. Checked outside, no response. 
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
## 2021-07-02 ENCOUNTER — Emergency Department (HOSPITAL_COMMUNITY): Payer: Self-pay

## 2021-07-02 ENCOUNTER — Emergency Department (HOSPITAL_COMMUNITY)
Admission: EM | Admit: 2021-07-02 | Discharge: 2021-07-02 | Disposition: A | Discharge status: Home / Self Care | Payer: Self-pay | Attending: Emergency Medicine | Admitting: Emergency Medicine

## 2021-07-02 ENCOUNTER — Encounter (HOSPITAL_COMMUNITY): Payer: Self-pay | Admitting: Emergency Medicine

## 2021-07-02 ENCOUNTER — Other Ambulatory Visit: Payer: Self-pay

## 2021-07-02 DIAGNOSIS — R110 Nausea: Secondary | ICD-10-CM | POA: Insufficient documentation

## 2021-07-02 DIAGNOSIS — Z7982 Long term (current) use of aspirin: Secondary | ICD-10-CM | POA: Insufficient documentation

## 2021-07-02 DIAGNOSIS — R079 Chest pain, unspecified: Secondary | ICD-10-CM | POA: Insufficient documentation

## 2021-07-02 DIAGNOSIS — I10 Essential (primary) hypertension: Secondary | ICD-10-CM | POA: Insufficient documentation

## 2021-07-02 DIAGNOSIS — R0602 Shortness of breath: Secondary | ICD-10-CM | POA: Insufficient documentation

## 2021-07-02 DIAGNOSIS — R42 Dizziness and giddiness: Secondary | ICD-10-CM | POA: Insufficient documentation

## 2021-07-02 DIAGNOSIS — R109 Unspecified abdominal pain: Secondary | ICD-10-CM | POA: Insufficient documentation

## 2021-07-02 LAB — LIPASE, BLOOD: Lipase: 38 U/L (ref 11–51)

## 2021-07-02 LAB — RAPID URINE DRUG SCREEN, HOSP PERFORMED
Amphetamines: NOT DETECTED
Barbiturates: NOT DETECTED
Benzodiazepines: NOT DETECTED
Cocaine: POSITIVE — AB
Opiates: NOT DETECTED
Tetrahydrocannabinol: POSITIVE — AB

## 2021-07-02 LAB — CBC WITH DIFFERENTIAL/PLATELET
Abs Immature Granulocytes: 0.02 10*3/uL (ref 0.00–0.07)
Basophils Absolute: 0.1 10*3/uL (ref 0.0–0.1)
Basophils Relative: 1 %
Eosinophils Absolute: 0.5 10*3/uL (ref 0.0–0.5)
Eosinophils Relative: 7 %
HCT: 43.2 % (ref 39.0–52.0)
Hemoglobin: 13.8 g/dL (ref 13.0–17.0)
Immature Granulocytes: 0 %
Lymphocytes Relative: 31 %
Lymphs Abs: 2.4 10*3/uL (ref 0.7–4.0)
MCH: 29.2 pg (ref 26.0–34.0)
MCHC: 31.9 g/dL (ref 30.0–36.0)
MCV: 91.3 fL (ref 80.0–100.0)
Monocytes Absolute: 0.5 10*3/uL (ref 0.1–1.0)
Monocytes Relative: 6 %
Neutro Abs: 4.3 10*3/uL (ref 1.7–7.7)
Neutrophils Relative %: 55 %
Platelets: 275 10*3/uL (ref 150–400)
RBC: 4.73 MIL/uL (ref 4.22–5.81)
RDW: 13.7 % (ref 11.5–15.5)
WBC: 7.7 10*3/uL (ref 4.0–10.5)
nRBC: 0 % (ref 0.0–0.2)

## 2021-07-02 LAB — TROPONIN I (HIGH SENSITIVITY)
Troponin I (High Sensitivity): 10 ng/L (ref <18)
Troponin I (High Sensitivity): 11 ng/L (ref <18)

## 2021-07-02 LAB — COMPREHENSIVE METABOLIC PANEL
ALT: 30 U/L (ref 0–44)
AST: 18 U/L (ref 15–41)
Albumin: 4.4 g/dL (ref 3.5–5.0)
Alkaline Phosphatase: 68 U/L (ref 38–126)
Anion gap: 12 (ref 5–15)
BUN: 15 mg/dL (ref 6–20)
CO2: 20 mmol/L — ABNORMAL LOW (ref 22–32)
Calcium: 9.7 mg/dL (ref 8.9–10.3)
Chloride: 106 mmol/L (ref 98–111)
Creatinine, Ser: 0.76 mg/dL (ref 0.61–1.24)
GFR, Estimated: >60 mL/min (ref >60)
Glucose, Bld: 85 mg/dL (ref 70–99)
Potassium: 4 mmol/L (ref 3.5–5.1)
Sodium: 138 mmol/L (ref 135–145)
Total Bilirubin: 0.6 mg/dL (ref 0.3–1.2)
Total Protein: 7.8 g/dL (ref 6.5–8.1)

## 2021-07-02 LAB — URINALYSIS, ROUTINE W REFLEX MICROSCOPIC
Bilirubin Urine: NEGATIVE
Glucose, UA: NEGATIVE mg/dL
Hgb urine dipstick: NEGATIVE
Ketones, ur: NEGATIVE mg/dL
Leukocytes,Ua: NEGATIVE
Nitrite: NEGATIVE
Protein, ur: NEGATIVE mg/dL
Specific Gravity, Urine: 1.024 (ref 1.005–1.030)
pH: 5 (ref 5.0–8.0)

## 2021-07-02 MED ORDER — ONDANSETRON 4 MG PO TBDP
8.0000 mg | ORAL_TABLET | Freq: Once | ORAL | Status: AC
  Administered 2021-07-02: 8 mg via ORAL
  Filled 2021-07-02: qty 2

## 2021-07-02 MED ORDER — ACETAMINOPHEN 500 MG PO TABS
1000.0000 mg | ORAL_TABLET | Freq: Once | ORAL | Status: AC
  Administered 2021-07-02: 1000 mg via ORAL
  Filled 2021-07-02: qty 2

## 2021-07-02 NOTE — ED Provider Notes (Signed)
?[facility] EMERGENCY DEPARTMENT ?Provider Note ? ? ?CSN: [id number] ?Arrival date & time: 07/02/21  0110 ? ?  ? ?History ? ?Chief Complaint  ?Patient presents with  ? Hypertension  ? ? ?Danny Villa is a 46 y.o. male. ? ?46 yo M who presents with multiple complaints. It seems his main reason for coming to the ED today was dizziness and headache which he thought might be related to high blood pressure. He also relays episodes of nausea, chest pain, shortness of breath, abdominal pain associated with a 'knot' feeling, weakness, decreased appetite. States symptoms have been progressively worsening but the dizziness and headache seem more new and is the cause of concern. No focal weakness or paresthesias. No trauma. No other associated symptoms.  ? ? ?Hypertension ? ? ?  ? ?Home Medications ?Prior to Admission medications   ?Medication Sig Start Date End Date Taking? Authorizing Provider  ?Aspirin-Salicylamide-Caffeine (BC HEADACHE POWDER PO) Take 1 packet by mouth as needed (for headaches).    [provider]  ?ibuprofen (ADVIL) 200 MG tablet Take 200-400 mg by mouth every 6 (six) hours as needed for headache or mild pain.    [provider]  ?   ? ?Allergies    ?Shellfish allergy   ? ?Review of Systems   ?Review of Systems ? ?Physical Exam ?Updated Vital Signs ?BP (!) 154/74 (BP Location: Right Arm)   Pulse 68   Temp 98 ?F (36.7 ?C)   Resp 15   Ht 6' (1.829 m)   Wt 81.6 kg   SpO2 99%   BMI 24.41 kg/m?  ?Physical Exam ?Vitals and nursing note reviewed.  ?Constitutional:   ?   Appearance: He is well-developed.  ?HENT:  ?   Head: Normocephalic and atraumatic.  ?   Nose: No congestion or rhinorrhea.  ?   Mouth/Throat:  ?   Mouth: Mucous membranes are moist.  ?   Pharynx: Oropharynx is clear.  ?Eyes:  ?   Pupils: Pupils are equal, round, and reactive to light.  ?Cardiovascular:  ?   Rate and Rhythm: Normal rate.  ?Pulmonary:  ?   Effort: Pulmonary effort is normal. No  respiratory distress.  ?Abdominal:  ?   General: There is no distension.  ?Musculoskeletal:     ?   General: Normal range of motion.  ?   Cervical back: Normal range of motion.  ?Neurological:  ?   General: No focal deficit present.  ?   Mental Status: He is alert.  ?   Comments: No altered mental status, able to give full seemingly accurate history.  ?Face is symmetric, EOM's intact, pupils equal and reactive, vision intact, tongue and uvula midline without deviation. ?Upper and Lower extremity motor 5/5, intact pain perception in distal extremities, 2+ reflexes in biceps, patella and achilles tendons. Walks without assistance or evident ataxia.   ? ? ?ED Results / Procedures / Treatments   ?Labs ?(all labs ordered are listed, but only abnormal results are displayed) ?Labs Reviewed  ?COMPREHENSIVE METABOLIC PANEL - Abnormal; Notable for the following components:  ?    Result Value  ? CO2 20 (*)   ? All other components within normal limits  ?RAPID URINE DRUG SCREEN, HOSP PERFORMED - Abnormal; Notable for the following components:  ? Cocaine POSITIVE (*)   ? Tetrahydrocannabinol POSITIVE (*)   ? All other components within normal limits  ?CBC WITH DIFFERENTIAL/PLATELET  ?LIPASE, BLOOD  ?URINALYSIS, ROUTINE W REFLEX MICROSCOPIC  ?TROPONIN I (  HIGH SENSITIVITY)  ?TROPONIN I (HIGH SENSITIVITY)  ? ? ?EKG ?None ? ?Radiology ?DG Chest 2 View ? ?Result Date: 07/02/2021 ?CLINICAL DATA:  Chest pain EXAM: CHEST - 2 VIEW COMPARISON:  10/15/2013 FINDINGS: The heart size and mediastinal contours are within normal limits. Both lungs are clear. The visualized skeletal structures are unremarkable. IMPRESSION: No active cardiopulmonary disease. Electronically Signed   By: Deatra Robinson M.D.   On: 07/02/2021 01:59  ? ?CT Head Wo Contrast ? ?Result Date: 07/02/2021 ?CLINICAL DATA:  Sudden onset severe headache. EXAM: CT HEAD WITHOUT CONTRAST TECHNIQUE: Contiguous axial images were obtained from the base of the skull through the vertex  without intravenous contrast. RADIATION DOSE REDUCTION: This exam was performed according to the departmental dose-optimization program which includes automated exposure control, adjustment of the mA and/or kV according to patient size and/or use of iterative reconstruction technique. COMPARISON:  12/07/2020 FINDINGS: Brain: There is no evidence for acute hemorrhage, hydrocephalus, mass lesion, or abnormal extra-axial fluid collection. No definite CT evidence for acute infarction. Vascular: No hyperdense vessel or unexpected calcification. Skull: No evidence for fracture. No worrisome lytic or sclerotic lesion. Sinuses/Orbits: The visualized paranasal sinuses and mastoid air cells are clear. Visualized portions of the globes and intraorbital fat are unremarkable. Other: None. IMPRESSION: Stable.  No acute intracranial abnormality. Electronically Signed   By: Kennith Center M.D.   On: 07/02/2021 05:30   ? ?Procedures ?Procedures  ? ? ?Medications Ordered in ED ?Medications  ?ondansetron (ZOFRAN-ODT) disintegrating tablet 8 mg (8 mg Oral Given 07/02/21 6144)  ?acetaminophen (TYLENOL) tablet 1,000 mg (1,000 mg Oral Given 07/02/21 3154)  ? ? ?ED Course/ Medical Decision Making/ A&P ?  ?                        ?Medical Decision Making ?Amount and/or Complexity of Data Reviewed ?Radiology: ordered. ? ?Risk ?OTC drugs. ?Prescription drug management. ? ? ?Appears well on exam. UDS positive for cocaine which can certainly cause hypertension which could lead to other symptoms. 2/2 this patient had a head CT completed which was normal. Rest of workup normal and BP was ner normal without intervention. Referred to PCP for further workup of symptoms.  ? ?Of note, patient upset that I talked to him about his cocaine use. I tried to explain that the reasoning for it was to help ascertain the cause for his hypertension and possibly some steps to help reduce that in the future. Either way, I counseled him on cessation but not sure he was  receptive to my advice.  ? ?Final Clinical Impression(s) / ED Diagnoses ?Final diagnoses:  ?Hypertension, unspecified type  ?Dizziness  ? ? ?Rx / DC Orders ?ED Discharge Orders   ? ? None  ? ?  ? ? ?  ?Marily Memos, MD ?07/02/21 2322 ? ?
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
## 2021-07-02 NOTE — ED Provider Triage Note (Signed)
Emergency Medicine Provider Triage Evaluation Note ? ?Danny Villa , a 46 y.o. male  was evaluated in triage.  Pt complains of multiple complaints. ? ?Review of Systems  ?Positive: Cp, sob, abd pain, nausea, fatigue ?Negative: Fever, cough, dysuria ? ?Physical Exam  ?BP (!) 183/106   Pulse 81   Temp 98.4 ?F (36.9 ?C)   Resp 18   Ht 6' (1.829 m)   Wt 81.6 kg   SpO2 95%   BMI 24.41 kg/m?  ?Gen:   Awake, no distress   ?Resp:  Normal effort  ?MSK:   Moves extremities without difficulty  ?Other:   ? ?Medical Decision Making  ?Medically screening exam initiated at 1:20 AM.  Appropriate orders placed.  Danny Villa was informed that the remainder of the evaluation will be completed by another provider, this initial triage assessment does not replace that evaluation, and the importance of remaining in the ED until their evaluation is complete. ? ?Here with multiple complaints.  3 days of nausea, vomiting, a "knot" in his abdomen, and having cp with SOB.   ?  ?Fayrene Helper, PA-C ?07/02/21 0125 ? ?

## 2021-10-07 ENCOUNTER — Emergency Department (HOSPITAL_COMMUNITY): Admission: EM | Admit: 2021-10-07 | Discharge: 2021-10-07 | Discharge status: Against Medical Advice | Payer: Self-pay

## 2021-10-07 NOTE — ED Notes (Signed)
Patient did not answer for triage 

## 2021-10-07 NOTE — ED Notes (Signed)
No answer for tirage  

## 2021-10-08 ENCOUNTER — Emergency Department (HOSPITAL_COMMUNITY): Payer: Commercial Managed Care - HMO

## 2021-10-08 ENCOUNTER — Emergency Department (HOSPITAL_COMMUNITY)
Admission: EM | Admit: 2021-10-08 | Discharge: 2021-10-08 | Disposition: A | Discharge status: Home / Self Care | Payer: Commercial Managed Care - HMO | Attending: Student | Admitting: Student

## 2021-10-08 ENCOUNTER — Encounter (HOSPITAL_COMMUNITY): Payer: Self-pay | Admitting: Emergency Medicine

## 2021-10-08 DIAGNOSIS — I10 Essential (primary) hypertension: Secondary | ICD-10-CM | POA: Diagnosis not present

## 2021-10-08 DIAGNOSIS — M48061 Spinal stenosis, lumbar region without neurogenic claudication: Secondary | ICD-10-CM | POA: Diagnosis not present

## 2021-10-08 DIAGNOSIS — F17210 Nicotine dependence, cigarettes, uncomplicated: Secondary | ICD-10-CM | POA: Diagnosis not present

## 2021-10-08 DIAGNOSIS — M545 Low back pain, unspecified: Secondary | ICD-10-CM | POA: Diagnosis present

## 2021-10-08 MED ORDER — METHYLPREDNISOLONE 4 MG PO TBPK: ORAL_TABLET | ORAL | 0 refills | Status: DC

## 2021-10-08 NOTE — ED Notes (Signed)
Pt verbalized understanding of d/c instructions, meds, and followup care. Denies questions. VSS, no distress noted. Steady gait to exit with all belongings.  ?
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
## 2021-10-08 NOTE — ED Notes (Signed)
Patient did not respond to call for vitals x3

## 2022-02-07 ENCOUNTER — Other Ambulatory Visit: Payer: Self-pay

## 2022-02-07 ENCOUNTER — Encounter (HOSPITAL_BASED_OUTPATIENT_CLINIC_OR_DEPARTMENT_OTHER): Payer: Self-pay | Admitting: *Deleted

## 2022-02-07 DIAGNOSIS — Z5321 Procedure and treatment not carried out due to patient leaving prior to being seen by health care provider: Secondary | ICD-10-CM | POA: Diagnosis not present

## 2022-02-07 DIAGNOSIS — S0181XA Laceration without foreign body of other part of head, initial encounter: Secondary | ICD-10-CM | POA: Diagnosis not present

## 2022-02-07 DIAGNOSIS — S0993XA Unspecified injury of face, initial encounter: Secondary | ICD-10-CM | POA: Diagnosis present
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
## 2022-02-08 ENCOUNTER — Emergency Department (HOSPITAL_BASED_OUTPATIENT_CLINIC_OR_DEPARTMENT_OTHER)
Admission: EM | Admit: 2022-02-08 | Discharge: 2022-02-08 | Discharge status: Against Medical Advice | Payer: Commercial Managed Care - HMO | Attending: Emergency Medicine | Admitting: Emergency Medicine

## 2022-02-08 NOTE — ED Notes (Signed)
Checked for pt in lobby.  Pt not visualized

## 2022-02-08 NOTE — ED Notes (Signed)
Checked for pt in lobby, not visualized

## 2022-02-08 NOTE — ED Notes (Addendum)
Call for pt to bring him to room, no answer.  Pt not visualized in lobby.

## 2022-06-14 ENCOUNTER — Encounter: Payer: Self-pay | Admitting: *Deleted

## 2022-06-14 NOTE — Congregational Nurse Program (Signed)
  Dept: [phone]   Congregational Nurse Program Note  Date of Encounter: 06/14/2022  Past Medical History: Past Medical History:  Diagnosis Date   Hypertension    Suicide attempt ([place]) 03/2014   ingested pills    Encounter Details:  CNP Questionnaire - 06/14/22 1410       Questionnaire   Ask client: Do you give verbal consent for me to treat you today? Yes    Student Assistance N/A    Location Patient Served  [facility]    Visit Setting with Client Organization    Patient Status Unknown    Sport and exercise psychologist or [facility]    Insurance/Financial Assistance Referral N/A    Medication Have Medication Insecurities    Medical Provider No    Screening Referrals Made N/A    Medical Referrals Made [facility]    Medical Appointment Made N/A    Recently w/o PCP, now 1st time PCP visit completed due to CNs referral or appointment made N/A    Food N/A    Transportation N/A    Housing/Utilities N/A    Interpersonal Safety N/A    Interventions Advocate/Support    Abnormal to Normal Screening Since Last CN Visit N/A    Screenings CN Performed Blood Pressure    Sent Client to Lab for: N/A    Did client attend any of the following based off CNs referral or appointments made? N/A    ED Visit Averted N/A    Life-Saving Intervention Made N/A           Client came to nurse's office seeking help with blood pressure medications. Client was a former patient of Marliss Coots NP and reports he now has insurance. He has been off of his blood pressure medication. Checked BP. Blood pressure (!) 158/100. Educated client on risk factors of elevated blood pressure. Referred client to [facility] walk in appt and discussed schedule. This is to assist client with medications faster.Offered to assist client with an appt to establish PCP. Client plans to go to [facility] then establish appt for PCP. Oneisha Ammons W RN CN

## 2022-07-16 ENCOUNTER — Ambulatory Visit: Payer: Commercial Managed Care - HMO | Admitting: Cardiology

## 2022-08-02 ENCOUNTER — Ambulatory Visit: Payer: Commercial Managed Care - HMO | Admitting: Cardiology

## 2022-12-23 IMAGING — CT CT HEAD W/O CM
4 series · 17 of 47 positions shown, 19 images · non-contrast
Comparison: 12/07/2020

CLINICAL DATA: Sudden onset severe headache.



[Series 3: head without · axial · non-contrast · 0.42mm/px · z∈[-162,-37]mm · 7 of 35 slices shown, 9 images]
[im 5/35  brain]
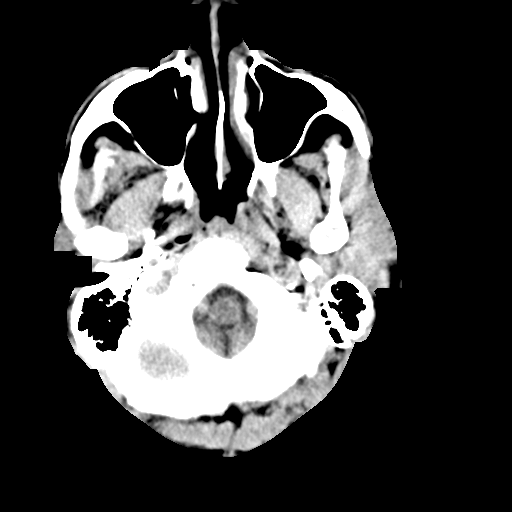
[im 5/35  bone]
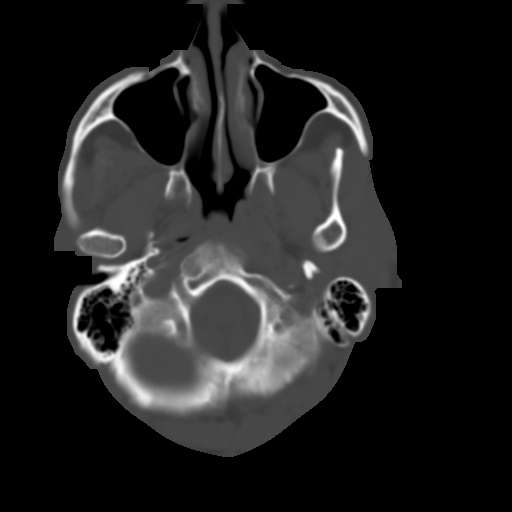
[im 9/35  brain]
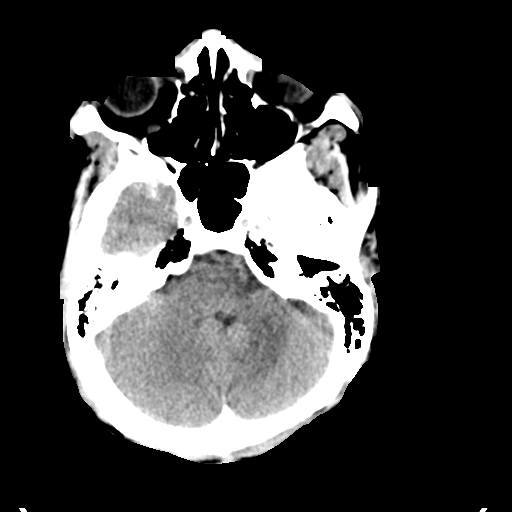
[im 13/35  brain]
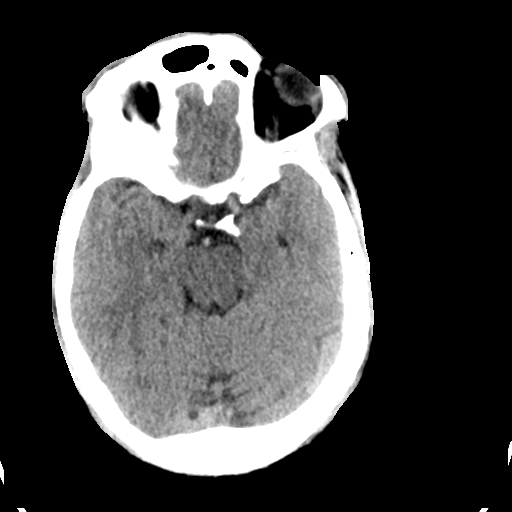
[im 18/35  brain]
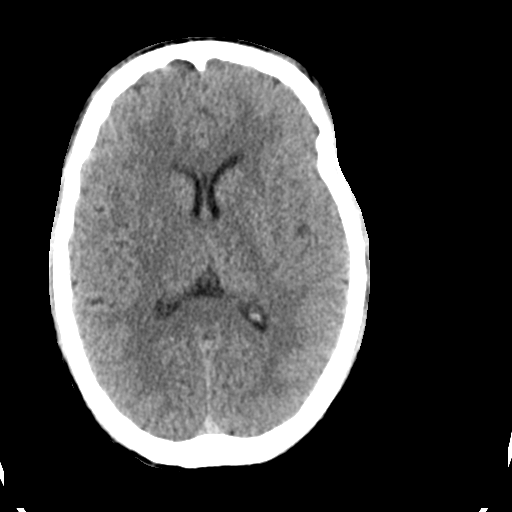
[im 22/35  brain]
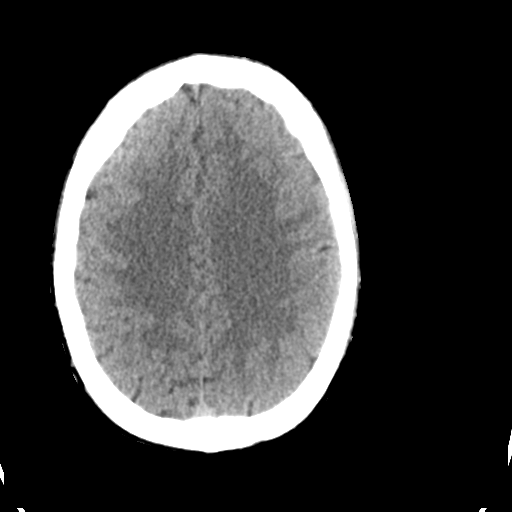
[im 22/35  bone]
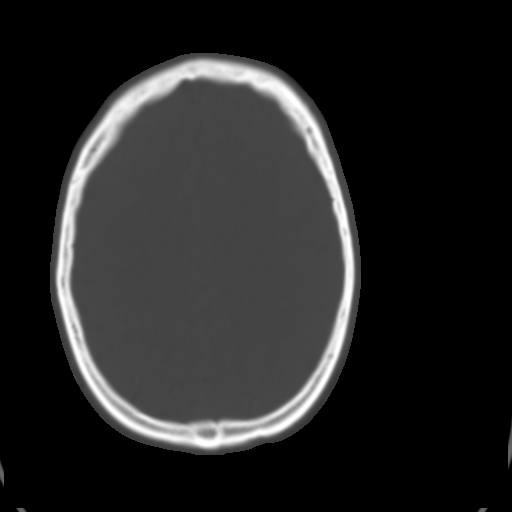
[im 26/35  brain]
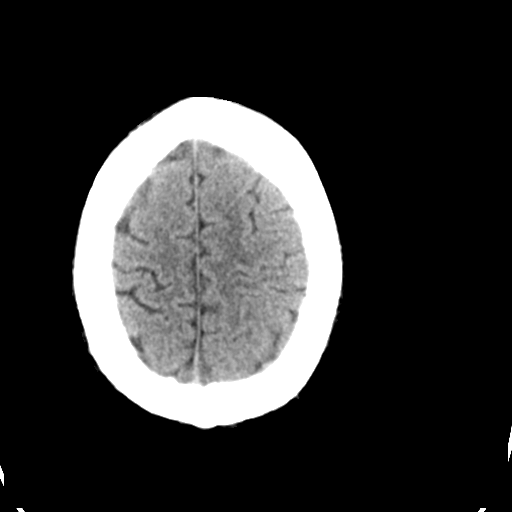
[im 30/35  brain]
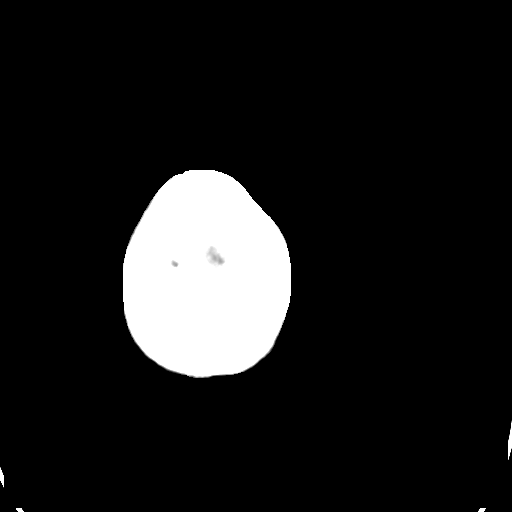

[Series 4: head bone · axial · 0.42mm/px · z∈[-166,-104]mm · 4 of 88 slices shown]
[im 9/88  bone]
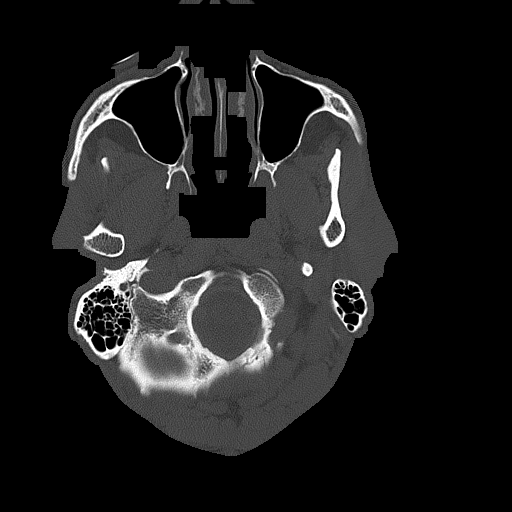
[im 18/88  bone]
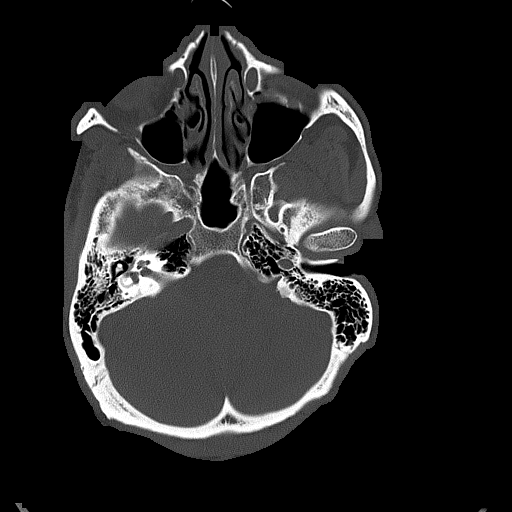
[im 27/88  bone]
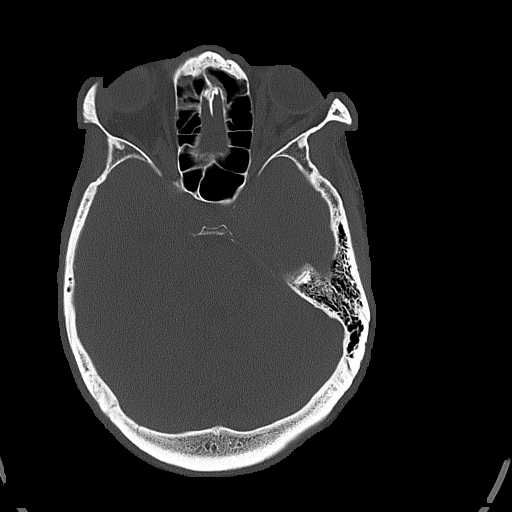
[im 40/88  bone]
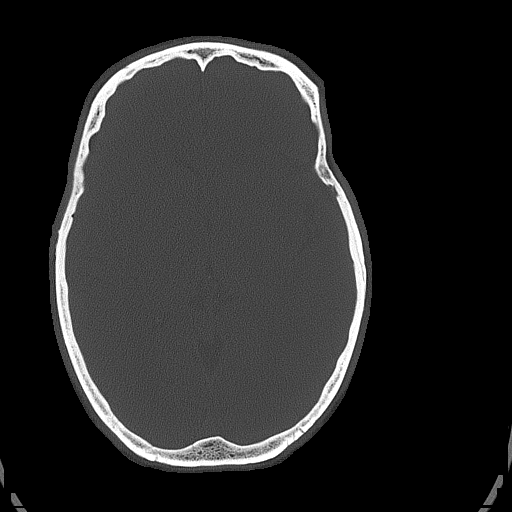

[Series 5: head without cor · coronal · non-contrast · 0.34mm/px · 3 of 69 slices shown]
[im 23/69  brain]
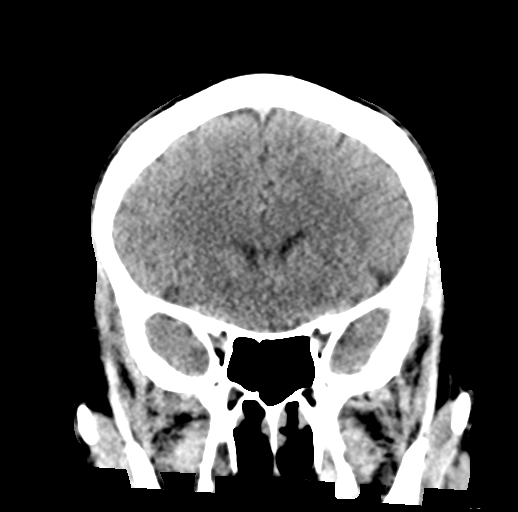
[im 31/69  brain]
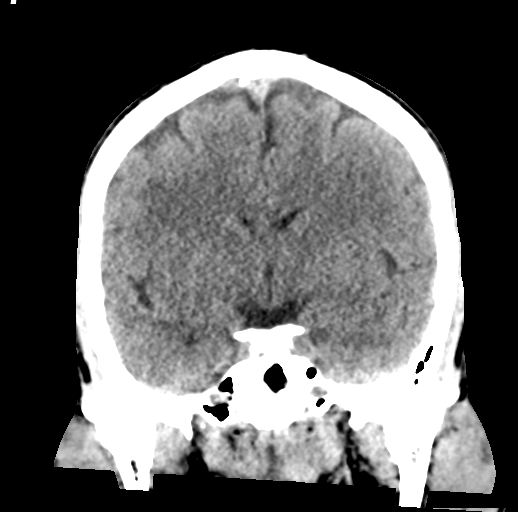
[im 38/69  brain]
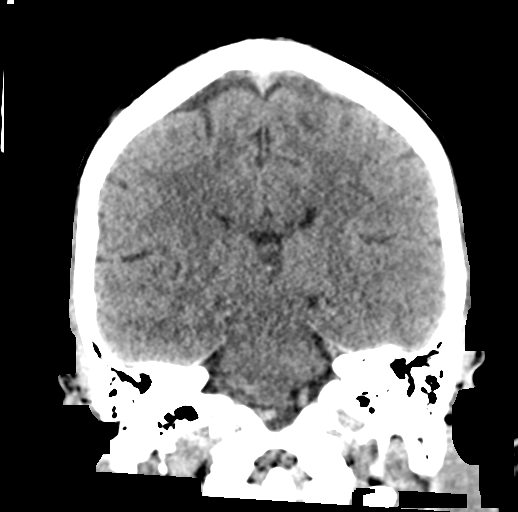

[Series 6: head without sag · sagittal · non-contrast · 0.34mm/px · 3 of 67 slices shown]
[im 23/67  brain]
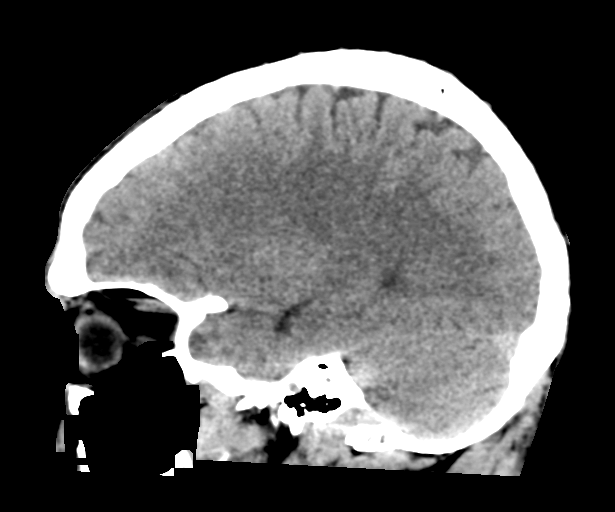
[im 34/67  brain]
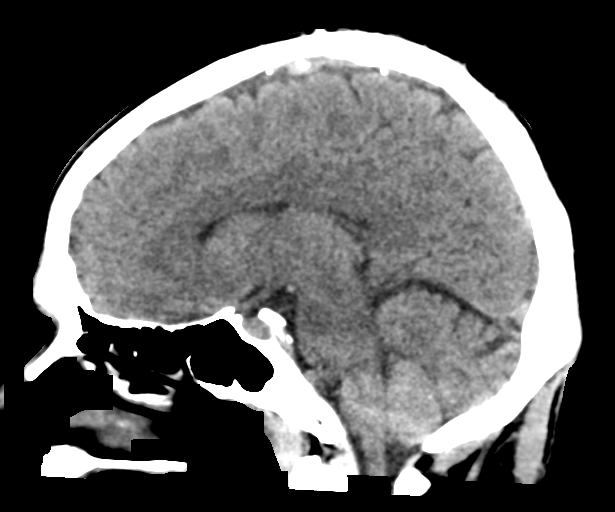
[im 45/67  brain]
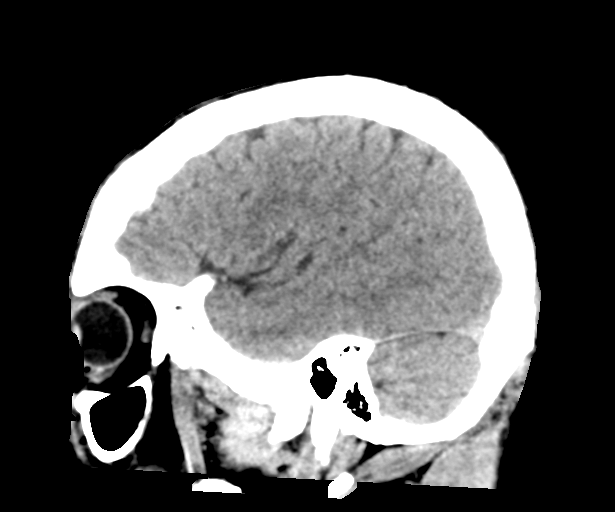

[17 of 47 positions shown; findings below may reference images not displayed]

FINDINGS: Brain: There is no evidence for acute hemorrhage, hydrocephalus,
mass lesion, or abnormal extra-axial fluid collection. No definite
CT evidence for acute infarction.

Vascular: No hyperdense vessel or unexpected calcification.

Skull: No evidence for fracture. No worrisome lytic or sclerotic
lesion.

Sinuses/Orbits: The visualized paranasal sinuses and mastoid air
cells are clear. Visualized portions of the globes and intraorbital
fat are unremarkable.

Other: None.
IMPRESSION: Stable.  No acute intracranial abnormality.

## 2022-12-28 ENCOUNTER — Ambulatory Visit (INDEPENDENT_AMBULATORY_CARE_PROVIDER_SITE_OTHER): Payer: Commercial Managed Care - HMO | Admitting: Nurse Practitioner

## 2022-12-28 ENCOUNTER — Encounter: Payer: Self-pay | Admitting: Nurse Practitioner

## 2022-12-28 VITALS — BP 125/87 | HR 55 | Resp 16 | Ht 70.5 in | Wt 191.8 lb

## 2022-12-28 DIAGNOSIS — Z1211 Encounter for screening for malignant neoplasm of colon: Secondary | ICD-10-CM

## 2022-12-28 DIAGNOSIS — G4700 Insomnia, unspecified: Secondary | ICD-10-CM

## 2022-12-28 DIAGNOSIS — F1290 Cannabis use, unspecified, uncomplicated: Secondary | ICD-10-CM

## 2022-12-28 DIAGNOSIS — I10 Essential (primary) hypertension: Secondary | ICD-10-CM

## 2022-12-28 DIAGNOSIS — Z13228 Encounter for screening for other metabolic disorders: Secondary | ICD-10-CM

## 2022-12-28 DIAGNOSIS — F32A Depression, unspecified: Secondary | ICD-10-CM

## 2022-12-28 DIAGNOSIS — Z130 Encounter for screening for diseases of the blood and blood-forming organs and certain disorders involving the immune mechanism: Secondary | ICD-10-CM

## 2022-12-28 DIAGNOSIS — Z1329 Encounter for screening for other suspected endocrine disorder: Secondary | ICD-10-CM

## 2022-12-28 DIAGNOSIS — R110 Nausea: Secondary | ICD-10-CM | POA: Diagnosis not present

## 2022-12-28 DIAGNOSIS — F419 Anxiety disorder, unspecified: Secondary | ICD-10-CM | POA: Insufficient documentation

## 2022-12-28 DIAGNOSIS — Z1321 Encounter for screening for nutritional disorder: Secondary | ICD-10-CM

## 2022-12-28 DIAGNOSIS — K219 Gastro-esophageal reflux disease without esophagitis: Secondary | ICD-10-CM | POA: Insufficient documentation

## 2022-12-28 DIAGNOSIS — Z720 Tobacco use: Secondary | ICD-10-CM

## 2022-12-28 MED ORDER — PANTOPRAZOLE SODIUM 40 MG PO TBEC: 40.0000 mg | DELAYED_RELEASE_TABLET | Freq: Every day | ORAL | 3 refills | Status: DC

## 2022-12-28 MED ORDER — ONDANSETRON HCL 4 MG PO TABS: 4.0000 mg | ORAL_TABLET | Freq: Three times a day (TID) | ORAL | 0 refills | Status: DC | PRN

## 2022-12-28 MED ORDER — HYDROXYZINE PAMOATE 25 MG PO CAPS: 25.0000 mg | ORAL_CAPSULE | Freq: Every evening | ORAL | 2 refills | Status: DC | PRN

## 2022-12-28 MED ORDER — VALSARTAN-HYDROCHLOROTHIAZIDE 80-12.5 MG PO TABS
1.0000 | ORAL_TABLET | Freq: Every day | ORAL | 1 refills | Status: DC
Start: 2022-12-28 — End: 2023-06-17

## 2022-12-28 NOTE — Assessment & Plan Note (Signed)
Home sleep study ordered Decrease dose of Vistaril to 25 mg at bedtime as needed Has Seroquel ordered but he has not been taking the medication His anxiety and depression could also be contributing to insomnia Patient referred to psychiatrist

## 2022-12-28 NOTE — Assessment & Plan Note (Signed)
Vapes nicotine containing substance all day.  Cessation encouraged

## 2022-12-28 NOTE — Assessment & Plan Note (Signed)
Encouraged to quit smoking marijuana Zofran 4 mg every 8 hours as needed ordered

## 2022-12-28 NOTE — Assessment & Plan Note (Signed)
Start pantoprazole 40 mg daily Avoid fatty fried food, spicy foods, alcohol, caffeinated drinks, avoid laying down immediately after eating Follow-up in 2 months

## 2022-12-28 NOTE — Progress Notes (Signed)
New Patient Office Visit  Subjective:  Patient ID: Danny Villa, male    DOB: 1976/02/10  Age: 47 y.o. MRN: [id number]  CC:  Chief Complaint  Patient presents with   Establish Care    HPI Danny Villa is a 47 y.o. male  has a past medical history of Anxiety, Depression, GERD (gastroesophageal reflux disease), Hypertension, and Suicide attempt (HCC) (03/2014).  Patient presents to establish care for his chronic medical conditions.  He stated that he has been seeing a doctor at the [facility] who has been managing his medications.  Hypertension.  Currently on valsartan hydrochlorothiazide 80-12.5 mg 1 tablet daily.  No complaints of headache, edema, sometimes has burning sensation in his chest  GERD.  Patient complains of burning sensation in the chest, intermittent nausea.  He reports a known abdominal hernia that does not hurt.  He denies vomiting, constipation, diarrhea.  States that he drinks a lots of coffee daily.  Depression /anxiety/PTSD.  Patient stated that his depression is due to his daughter that got  missing 8 years ago.  He is Zoloft 50 mg daily, Seroquel 100 mg daily, hydroxyzine 50 mg daily.  Patient states that he has stopped taking Seroquel because he feels really sleepy during the day.  Also feels that hydroxyzine 50 mg daily is too strong for him as he still feels sleepy during the day with just hydroxyzine 50 mg .he currently denies SI, HI.  Not seeing a psychiatrist  Insomnia.  Has Seroquel 100 mg daily, hydroxyzine 50 mg daily ordered.  States that Both medications makes him feel sleepy during the day.  Patient states that he gets about 4 hours of sleep at night, snores but has not been told that he quits breathing in his sleep.  Patient states that he vapes all day long, he also vapes marijuana daily.  Cessation encouraged     Past Medical History:  Diagnosis Date   Anxiety    Depression    GERD (gastroesophageal reflux disease)    Hypertension    Suicide  attempt (HCC) 03/2014   ingested pills    History reviewed. No pertinent surgical history.  Family History  Problem Relation Age of Onset   Mental illness Mother    Colon cancer Neg Hx     Social History   Socioeconomic History   Marital status: Married    Spouse name: Not on file   Number of children: 10   Years of education: Not on file   Highest education level: Not on file  Occupational History   Not on file  Tobacco Use   Smoking status: Former    Current packs/day: 0.00    Types: Cigarettes    Quit date: 05/2022    Years since quitting: 0.5   Smokeless tobacco: Never  Vaping Use   Vaping status: Every Day  Substance and Sexual Activity   Alcohol use: Yes    Comment: occasionally   Drug use: Yes    Types: Marijuana   Sexual activity: Yes  Other Topics Concern   Not on file  Social History Narrative   Lives with his room mates .    Social Determinants of Health   Financial Resource Strain: Not on file  Food Insecurity: Not on file  Transportation Needs: Not on file  Physical Activity: Not on file  Stress: Not on file  Social Connections: Not on file  Intimate Partner Violence: Not on file    ROS Review of Systems  Constitutional:  Negative for activity change, appetite change, chills, diaphoresis, fatigue, fever and unexpected weight change.  HENT:  Negative for congestion, dental problem, drooling and ear discharge.   Eyes:  Negative for pain, discharge, redness and itching.  Respiratory:  Negative for apnea, cough, choking, chest tightness, shortness of breath and wheezing.   Cardiovascular: Negative.  Negative for chest pain, palpitations and leg swelling.  Gastrointestinal:  Positive for nausea. Negative for abdominal distention, abdominal pain, anal bleeding, blood in stool, constipation, diarrhea and vomiting.  Endocrine: Negative for polydipsia, polyphagia and polyuria.  Genitourinary:  Negative for difficulty urinating, flank pain, frequency  and genital sores.  Musculoskeletal: Negative.  Negative for arthralgias, back pain, gait problem and joint swelling.  Skin:  Negative for color change, pallor and rash.  Neurological:  Negative for dizziness, facial asymmetry, light-headedness, numbness and headaches.  Psychiatric/Behavioral:  Positive for behavioral problems and sleep disturbance. Negative for agitation, confusion, hallucinations, self-injury and suicidal ideas. The patient is nervous/anxious.     Objective:   Today's Vitals: BP 125/87   Pulse (!) 55   Resp 16   Ht 5' 10.5" (1.791 m)   Wt 191 lb 12.8 oz (87 kg)   SpO2 98%   BMI 27.13 kg/m   Physical Exam Vitals and nursing note reviewed.  Constitutional:      General: He is not in acute distress.    Appearance: Normal appearance. He is not ill-appearing, toxic-appearing or diaphoretic.  HENT:     Mouth/Throat:     Mouth: Mucous membranes are moist.     Pharynx: Oropharynx is clear. No oropharyngeal exudate or posterior oropharyngeal erythema.  Eyes:     General: No scleral icterus.       Right eye: No discharge.        Left eye: No discharge.     Extraocular Movements: Extraocular movements intact.     Conjunctiva/sclera: Conjunctivae normal.  Cardiovascular:     Rate and Rhythm: Normal rate and regular rhythm.     Pulses: Normal pulses.     Heart sounds: Normal heart sounds. No murmur heard.    No friction rub. No gallop.  Pulmonary:     Effort: Pulmonary effort is normal. No respiratory distress.     Breath sounds: Normal breath sounds. No stridor. No wheezing, rhonchi or rales.  Chest:     Chest wall: No tenderness.  Abdominal:     General: There is no distension.     Palpations: Abdomen is soft. There is mass.     Tenderness: There is no abdominal tenderness. There is no right CVA tenderness, left CVA tenderness or guarding.     Hernia: A hernia is present.     Comments: A small firm mobile mass palpated in the mid epigastric area. No tenderness  or protrusion noted   Musculoskeletal:        General: No swelling, tenderness, deformity or signs of injury.     Right lower leg: No edema.     Left lower leg: No edema.  Skin:    General: Skin is warm and dry.     Capillary Refill: Capillary refill takes less than 2 seconds.     Coloration: Skin is not jaundiced or pale.     Findings: No bruising, erythema or lesion.  Neurological:     Mental Status: He is alert and oriented to person, place, and time.     Motor: No weakness.     Coordination: Coordination normal.  Gait: Gait normal.  Psychiatric:        Mood and Affect: Mood normal.        Behavior: Behavior normal.        Thought Content: Thought content normal.        Judgment: Judgment normal.     Assessment & Plan:   Problem List Items Addressed This Visit       Cardiovascular and Mediastinum   Primary hypertension    BP Readings from Last 3 Encounters:  12/28/22 125/87  06/14/22 (!) 158/100  02/07/22 (!) 152/98   HTN Controlled on valsartan-Avapro thiazide 80-12.5 mg 1 tablet daily Continue current medications. No changes in management. CMP today        Relevant Medications   valsartan-hydrochlorothiazide (DIOVAN-HCT) 80-12.5 MG tablet     Digestive   Gastroesophageal reflux disease - Primary    Start pantoprazole 40 mg daily Avoid fatty fried food, spicy foods, alcohol, caffeinated drinks, avoid laying down immediately after eating Follow-up in 2 months      Relevant Medications   pantoprazole (PROTONIX) 40 MG tablet   ondansetron (ZOFRAN) 4 MG tablet     Other   Vapes nicotine containing substance    Vapes nicotine containing substance all day.  Cessation encouraged      Marijuana smoker    Smokes marijuana daily.  Cessation encouraged This  may also be contributing to his nausea      Insomnia    Home sleep study ordered Decrease dose of Vistaril to 25 mg at bedtime as needed Has Seroquel ordered but he has not been taking the  medication His anxiety and depression could also be contributing to insomnia Patient referred to psychiatrist      Relevant Medications   hydrOXYzine (VISTARIL) 25 MG capsule   Other Relevant Orders   Home sleep test   Nausea    Encouraged to quit smoking marijuana Zofran 4 mg every 8 hours as needed ordered      Relevant Medications   ondansetron (ZOFRAN) 4 MG tablet   Anxiety and depression       12/28/2022   12:30 PM  GAD 7 : Generalized Anxiety Score  Nervous, Anxious, on Edge 1  Control/stop worrying 1  Worry too much - different things 0  Trouble relaxing 3  Restless 3  Easily annoyed or irritable 0  Afraid - awful might happen 0  Total GAD 7 Score 8  Anxiety Difficulty Not difficult at all  PHQ score of 0 Continue Zoloft 50 mg daily Take hydroxyzine 25 mg at bedtime as needed Patient referred to psychiatrist Information to [facility] behavioral health clinic provided, I informed the patient that he have a walk-in clinic that he can utilize.      Relevant Medications   sertraline (ZOLOFT) 50 MG tablet   hydrOXYzine (VISTARIL) 25 MG capsule   Other Relevant Orders   Ambulatory referral to Psychiatry   Other Visit Diagnoses     Screening for colon cancer       Relevant Orders   Cologuard   Screening for endocrine, nutritional, metabolic and immunity disorder       Relevant Orders   CMP14+EGFR   CBC   Hemoglobin A1c   Hepatitis C antibody   HIV Antibody (routine testing w rflx)       Outpatient Encounter Medications as of 12/28/2022  Medication Sig   hydrOXYzine (VISTARIL) 25 MG capsule Take 1 capsule (25 mg total) by mouth at bedtime as needed.  ibuprofen (ADVIL) 200 MG tablet Take 200-400 mg by mouth every 6 (six) hours as needed for headache or mild pain.   ondansetron (ZOFRAN) 4 MG tablet Take 1 tablet (4 mg total) by mouth every 8 (eight) hours as needed for nausea or vomiting.   pantoprazole (PROTONIX) 40 MG tablet Take 1 tablet (40 mg  total) by mouth daily.   sertraline (ZOLOFT) 50 MG tablet Take 50 mg by mouth daily.   [DISCONTINUED] hydrOXYzine (ATARAX) 50 MG tablet Take 50 mg by mouth at bedtime.   [DISCONTINUED] valsartan-hydrochlorothiazide (DIOVAN-HCT) 80-12.5 MG tablet Take 1 tablet by mouth daily.   QUEtiapine (SEROQUEL) 100 MG tablet Take 100 mg by mouth at bedtime. (Patient not taking: Reported on 12/28/2022)   valsartan-hydrochlorothiazide (DIOVAN-HCT) 80-12.5 MG tablet Take 1 tablet by mouth daily.   [DISCONTINUED] Aspirin-Salicylamide-Caffeine (BC HEADACHE POWDER PO) Take 1 packet by mouth as needed (for headaches).   [DISCONTINUED] methylPREDNISolone (MEDROL DOSEPAK) 4 MG TBPK tablet Take as prescribed   No facility-administered encounter medications on file as of 12/28/2022.    Follow-up: Return in about 2 months (around 02/27/2023) for GERD.   Donell Beers, FNP

## 2022-12-28 NOTE — Assessment & Plan Note (Signed)
    12/28/2022   12:30 PM  GAD 7 : Generalized Anxiety Score  Nervous, Anxious, on Edge 1  Control/stop worrying 1  Worry too much - different things 0  Trouble relaxing 3  Restless 3  Easily annoyed or irritable 0  Afraid - awful might happen 0  Total GAD 7 Score 8  Anxiety Difficulty Not difficult at all  PHQ score of 0 Continue Zoloft 50 mg daily Take hydroxyzine 25 mg at bedtime as needed Patient referred to psychiatrist Information to [facility] behavioral health clinic provided, I informed the patient that he have a walk-in clinic that he can utilize.

## 2022-12-28 NOTE — Assessment & Plan Note (Signed)
Smokes marijuana daily.  Cessation encouraged This  may also be contributing to his nausea

## 2022-12-28 NOTE — Assessment & Plan Note (Addendum)
BP Readings from Last 3 Encounters:  12/28/22 125/87  06/14/22 (!) 158/100  02/07/22 (!) 152/98   HTN Controlled on valsartan-Avapro thiazide 80-12.5 mg 1 tablet daily Continue current medications. No changes in management. CMP today

## 2022-12-28 NOTE — Patient Instructions (Addendum)
1. Gastroesophageal reflux disease, unspecified whether esophagitis present  - pantoprazole (PROTONIX) 40 MG tablet; Take 1 tablet (40 mg total) by mouth daily.  Dispense: 30 tablet; Refill: 3  2. Screening for colon cancer  - Cologuard  3. Screening for endocrine, nutritional, metabolic and immunity disorder  - CMP14+EGFR - CBC - Hemoglobin A1c - Hepatitis C antibody - HIV Antibody (routine testing w rflx)  4. Nausea  - ondansetron (ZOFRAN) 4 MG tablet; Take 1 tablet (4 mg total) by mouth every 8 (eight) hours as needed for nausea or vomiting.  Dispense: 20 tablet; Refill: 0  5. Insomnia, unspecified type  - hydrOXYzine (VISTARIL) 25 MG capsule; Take 1 capsule (25 mg total) by mouth at bedtime as needed.  Dispense: 30 capsule; Refill: 2 - Home sleep test; Future  6. Current mild episode of major depressive disorder, unspecified whether recurrent (HCC)  - Ambulatory referral to Psychiatry  7. Marijuana smoker   8. Vapes nicotine containing substance   9. Primary hypertension  - valsartan-hydrochlorothiazide (DIOVAN-HCT) 80-12.5 MG tablet; Take 1 tablet by mouth daily.  Dispense: 90 tablet; Refill: 1       It is important that you exercise regularly at least 30 minutes 5 times a week as tolerated  Think about what you will eat, plan ahead. Choose " clean, green, fresh or frozen" over canned, processed or packaged foods which are more sugary, salty and fatty. 70 to 75% of food eaten should be vegetables and fruit. Three meals at set times with snacks allowed between meals, but they must be fruit or vegetables. Aim to eat over a 12 hour period , example 7 am to 7 pm, and STOP after  your last meal of the day. Drink water,generally about 64 ounces per day, no other drink is as healthy. Fruit juice is best enjoyed in a healthy way, by EATING the fruit.  Thanks for choosing [facility] we consider it a privelige to serve you.

## 2022-12-28 NOTE — Progress Notes (Signed)
Epworth Sleepiness Scale  Use the following scale to choose the most appropriate number for each situation. 0 Would never nod off 1  Slight  chance of nodding off 2 Moderate chance of nodding off 3 High chance of nodding off  Sitting and reading: 0 Watching TV: 0 Sitting, inactive, in a public place (e.g., in a meeting, theater, or dinner event): 0 As a passenger in a car for an hour or more without stopping for a break: 0 Lying down to rest when circumstances permit:0 Sitting and talking to someone: 0 Sitting quietly after a meal without alcohol: 0 In a car, while stopped for a few  minutes in traffic or at a light: 0  TOTOAL: 0

## 2022-12-29 LAB — CBC
Hematocrit: 43.7 % (ref 37.5–51.0)
Hemoglobin: 14.2 g/dL (ref 13.0–17.7)
MCH: 28.7 pg (ref 26.6–33.0)
MCHC: 32.5 g/dL (ref 31.5–35.7)
MCV: 88 fL (ref 79–97)
Platelets: 278 10*3/uL (ref 150–450)
RBC: 4.95 x10E6/uL (ref 4.14–5.80)
RDW: 13.3 % (ref 11.6–15.4)
WBC: 5.9 10*3/uL (ref 3.4–10.8)

## 2022-12-29 LAB — CMP14+EGFR
ALT: 21 [IU]/L (ref 0–44)
AST: 20 [IU]/L (ref 0–40)
Albumin: 4.8 g/dL (ref 4.1–5.1)
Alkaline Phosphatase: 93 [IU]/L (ref 44–121)
BUN/Creatinine Ratio: 23 — ABNORMAL HIGH (ref 9–20)
BUN: 22 mg/dL (ref 6–24)
Bilirubin Total: 0.3 mg/dL (ref 0.0–1.2)
CO2: 23 mmol/L (ref 20–29)
Calcium: 10 mg/dL (ref 8.7–10.2)
Chloride: 99 mmol/L (ref 96–106)
Creatinine, Ser: 0.94 mg/dL (ref 0.76–1.27)
Globulin, Total: 3.1 g/dL (ref 1.5–4.5)
Glucose: 97 mg/dL (ref 70–99)
Potassium: 4.6 mmol/L (ref 3.5–5.2)
Sodium: 136 mmol/L (ref 134–144)
Total Protein: 7.9 g/dL (ref 6.0–8.5)
eGFR: 99 mL/min/{1.73_m2} (ref >59)

## 2022-12-29 LAB — HEPATITIS C ANTIBODY: Hep C Virus Ab: NONREACTIVE

## 2022-12-29 LAB — HIV ANTIBODY (ROUTINE TESTING W REFLEX): HIV Screen 4th Generation wRfx: NONREACTIVE

## 2022-12-29 LAB — HEMOGLOBIN A1C
Est. average glucose Bld gHb Est-mCnc: 134 mg/dL
Hgb A1c MFr Bld: 6.3 % — ABNORMAL HIGH (ref 4.8–5.6)

## 2023-01-17 ENCOUNTER — Other Ambulatory Visit: Payer: Self-pay | Admitting: Nurse Practitioner

## 2023-01-17 DIAGNOSIS — R110 Nausea: Secondary | ICD-10-CM

## 2023-01-17 NOTE — Telephone Encounter (Signed)
Please advise [name]

## 2023-01-30 ENCOUNTER — Other Ambulatory Visit: Payer: Self-pay | Admitting: Nurse Practitioner

## 2023-01-30 DIAGNOSIS — R110 Nausea: Secondary | ICD-10-CM

## 2023-01-31 NOTE — Telephone Encounter (Signed)
Please advise KH 

## 2023-02-07 ENCOUNTER — Other Ambulatory Visit: Payer: Self-pay | Admitting: Nurse Practitioner

## 2023-02-27 ENCOUNTER — Other Ambulatory Visit: Payer: Self-pay | Admitting: Nurse Practitioner

## 2023-02-27 DIAGNOSIS — R110 Nausea: Secondary | ICD-10-CM

## 2023-02-28 ENCOUNTER — Ambulatory Visit: Payer: Self-pay | Admitting: Nurse Practitioner

## 2023-03-09 ENCOUNTER — Ambulatory Visit (HOSPITAL_COMMUNITY): Payer: Medicaid Other | Admitting: Licensed Clinical Social Worker

## 2023-03-20 ENCOUNTER — Other Ambulatory Visit: Payer: Self-pay | Admitting: Nurse Practitioner

## 2023-03-20 DIAGNOSIS — R110 Nausea: Secondary | ICD-10-CM

## 2023-03-28 ENCOUNTER — Encounter: Payer: Self-pay | Admitting: Nurse Practitioner

## 2023-03-28 ENCOUNTER — Ambulatory Visit (INDEPENDENT_AMBULATORY_CARE_PROVIDER_SITE_OTHER): Payer: Commercial Managed Care - HMO | Admitting: Nurse Practitioner

## 2023-03-28 ENCOUNTER — Telehealth: Payer: Self-pay

## 2023-03-28 VITALS — BP 134/73 | HR 49 | Temp 97.5°F | Wt 197.8 lb

## 2023-03-28 DIAGNOSIS — F419 Anxiety disorder, unspecified: Secondary | ICD-10-CM

## 2023-03-28 DIAGNOSIS — R7303 Prediabetes: Secondary | ICD-10-CM

## 2023-03-28 DIAGNOSIS — R110 Nausea: Secondary | ICD-10-CM

## 2023-03-28 DIAGNOSIS — K439 Ventral hernia without obstruction or gangrene: Secondary | ICD-10-CM | POA: Insufficient documentation

## 2023-03-28 DIAGNOSIS — G4700 Insomnia, unspecified: Secondary | ICD-10-CM

## 2023-03-28 DIAGNOSIS — M25649 Stiffness of unspecified hand, not elsewhere classified: Secondary | ICD-10-CM

## 2023-03-28 DIAGNOSIS — K219 Gastro-esophageal reflux disease without esophagitis: Secondary | ICD-10-CM

## 2023-03-28 DIAGNOSIS — M25521 Pain in right elbow: Secondary | ICD-10-CM | POA: Diagnosis not present

## 2023-03-28 DIAGNOSIS — F32A Depression, unspecified: Secondary | ICD-10-CM

## 2023-03-28 MED ORDER — METHYLPREDNISOLONE NA SUC (PF) 40 MG IJ SOLR
40.0000 mg | Freq: Once | INTRAMUSCULAR | Status: AC
  Administered 2023-03-28: 40 mg via INTRAMUSCULAR

## 2023-03-28 MED ORDER — METHYLPREDNISOLONE 4 MG PO TBPK: ORAL_TABLET | ORAL | 0 refills | Status: DC

## 2023-03-28 MED ORDER — QUETIAPINE FUMARATE 25 MG PO TABS
25.0000 mg | ORAL_TABLET | Freq: Every day | ORAL | 1 refills | Status: DC
Start: 2023-03-28 — End: 2023-05-09

## 2023-03-28 MED ORDER — KETOROLAC TROMETHAMINE 30 MG/ML IJ SOLN
30.0000 mg | Freq: Once | INTRAMUSCULAR | Status: AC
Start: 2023-03-28 — End: 2023-03-28
  Administered 2023-03-28: 30 mg via INTRAMUSCULAR

## 2023-03-28 NOTE — Addendum Note (Signed)
Addended by: Renelda Loma on: 03/28/2023 02:12 PM   Modules accepted: Orders

## 2023-03-28 NOTE — Assessment & Plan Note (Signed)
Toradol 30 mg injection in Solu-Medrol 40 mg injection given in the office today Start methylPREDNISolone (MEDROL DOSEPAK) 4 MG TBPK tablet tomorrow ; Take as instructed on the packaging  Dispense: 1 each; Refill: 0.  Patient encouraged to take medication with food and he was also told not to take medication with ibuprofen to prevent GI side effects.

## 2023-03-28 NOTE — Assessment & Plan Note (Signed)
Patient able to do range of motion of bilateral hands without difficulty in the office today He was encouraged to do range of motion exercises of bilateral hands daily and take ibuprofen if needed for pain.  Will refer patient to orthopedics at next visit if needed

## 2023-03-28 NOTE — Progress Notes (Signed)
Established Patient Office Visit  Subjective:  Patient ID: Danny Villa, male    DOB: 11-Feb-1976  Age: 47 y.o. MRN: [id number]  CC:  Chief Complaint  Patient presents with   Hernia    Just  nauseated  even with the medication    HPI Danny Villa is a 47 y.o. male  has a past medical history of Anxiety, Depression, GERD (gastroesophageal reflux disease), Hypertension, and Suicide attempt (HCC) (03/2014).  Patient present for follow-up for his chronic medical conditions  GERD.  Currently on pantoprazole 40 mg daily, stated that the medication has been helpful but still has some nausea in the mornings.  He denies abdominal pain, constipation, diarrhea.  He is worried about an hernia that was noted by his previous doctor, stated that the hernia does not hurt.  Insomnia.  Home sleep study was ordered but has not been completed, he has been taking hydroxyzine 50 mg at night and still not sleeping well, he was on Seroquel 100 mg daily that made him too sleepy in the past, he is willing to try a low-dose Seroquel.  He really thinks that his insomnia is due to his mental health disorders, has upcoming appointment with psychiatrist in January.   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  Currently has soreness rated 8/10.  He has been taking ibuprofen 800 mg twice daily that has not been helpful.  Patient complains of bilateral fourth finger stiffness that occurs usually in the mornings.  He is able to move his joints well after doing some range of motion exercises.  He denies numbness, pain, redness, swelling.     Past Medical History:  Diagnosis Date   Anxiety    Depression    GERD (gastroesophageal reflux disease)    Hypertension    Suicide attempt (HCC) 03/2014   ingested pills    History reviewed. No pertinent surgical  history.  Family History  Problem Relation Age of Onset   Mental illness Mother    Colon cancer Neg Hx     Social History   Socioeconomic History   Marital status: Married    Spouse name: Not on file   Number of children: 10   Years of education: Not on file   Highest education level: Not on file  Occupational History   Not on file  Tobacco Use   Smoking status: Former    Current packs/day: 0.00    Types: Cigarettes    Quit date: 05/2022    Years since quitting: 0.8   Smokeless tobacco: Never  Vaping Use   Vaping status: Every Day  Substance and Sexual Activity   Alcohol use: Yes    Comment: occasionally   Drug use: Yes    Types: Marijuana   Sexual activity: Yes  Other Topics Concern   Not on file  Social History Narrative   Lives with his room mates .    Social Drivers of Corporate investment banker Strain: Not on file  Food Insecurity: Not on file  Transportation Needs: Not on file  Physical Activity: Not on file  Stress: Not on file  Social Connections: Not on file  Intimate Partner Violence: Not on file    Outpatient Medications Prior to Visit  Medication Sig Dispense Refill   hydrOXYzine (VISTARIL) 25 MG capsule  Take 1 capsule (25 mg total) by mouth at bedtime as needed. 30 capsule 2   ibuprofen (ADVIL) 200 MG tablet Take 200-400 mg by mouth every 6 (six) hours as needed for headache or mild pain.     ondansetron (ZOFRAN) 4 MG tablet TAKE 1 TABLET(4 MG) BY MOUTH EVERY 8 HOURS AS NEEDED FOR NAUSEA OR VOMITING 20 tablet 0   pantoprazole (PROTONIX) 40 MG tablet Take 1 tablet (40 mg total) by mouth daily. 30 tablet 3   sertraline (ZOLOFT) 50 MG tablet TAKE 1 TABLET BY MOUTH DAILY 30 tablet 1   valsartan-hydrochlorothiazide (DIOVAN-HCT) 80-12.5 MG tablet Take 1 tablet by mouth daily. 90 tablet 1   QUEtiapine (SEROQUEL) 100 MG tablet Take 100 mg by mouth at bedtime. (Patient not taking: Reported on 03/28/2023)     No facility-administered medications prior to  visit.    Allergies  Allergen Reactions   Shellfish Allergy Anaphylaxis, Itching and Swelling    ROS Review of Systems  Constitutional:  Negative for appetite change, chills, fatigue and fever.  HENT:  Negative for congestion, postnasal drip, rhinorrhea and sneezing.   Respiratory:  Negative for cough, shortness of breath and wheezing.   Cardiovascular:  Negative for chest pain, palpitations and leg swelling.  Gastrointestinal:  Positive for nausea. Negative for abdominal pain, constipation and vomiting.  Genitourinary:  Negative for difficulty urinating, dysuria, flank pain and frequency.  Musculoskeletal:  Positive for arthralgias. Negative for back pain, joint swelling and myalgias.  Skin:  Negative for color change, pallor, rash and wound.  Neurological:  Negative for dizziness, facial asymmetry, weakness, numbness and headaches.  Psychiatric/Behavioral:  Positive for sleep disturbance. Negative for behavioral problems, confusion, self-injury and suicidal ideas.       Objective:    Physical Exam Vitals and nursing note reviewed.  Constitutional:      General: He is not in acute distress.    Appearance: Normal appearance. He is not ill-appearing, toxic-appearing or diaphoretic.  HENT:     Mouth/Throat:     Mouth: Mucous membranes are moist.     Pharynx: Oropharynx is clear. No oropharyngeal exudate or posterior oropharyngeal erythema.  Eyes:     General: No scleral icterus.       Right eye: No discharge.        Left eye: No discharge.     Extraocular Movements: Extraocular movements intact.     Conjunctiva/sclera: Conjunctivae normal.  Cardiovascular:     Rate and Rhythm: Normal rate and regular rhythm.     Pulses: Normal pulses.     Heart sounds: Normal heart sounds. No murmur heard.    No friction rub. No gallop.  Pulmonary:     Effort: Pulmonary effort is normal. No respiratory distress.     Breath sounds: Normal breath sounds. No stridor. No wheezing, rhonchi or  rales.  Chest:     Chest wall: No tenderness.  Abdominal:     General: There is no distension.     Palpations: Abdomen is soft.     Tenderness: There is no abdominal tenderness. There is no right CVA tenderness, left CVA tenderness or guarding.     Hernia: A hernia is present.     Comments: Hernia in the right mid upper abdomen, site is non tender on palpation   Musculoskeletal:        General: Tenderness present. No swelling, deformity or signs of injury.     Right lower leg: No edema.     Left lower leg:  No edema.     Comments: Tenderness on palpation of right elbow skin warm and dry no redness or swelling noted.  Skin:    General: Skin is warm and dry.     Capillary Refill: Capillary refill takes less than 2 seconds.     Coloration: Skin is not jaundiced or pale.     Findings: No bruising, erythema or lesion.  Neurological:     Mental Status: He is alert and oriented to person, place, and time.     Motor: No weakness.     Coordination: Coordination normal.     Gait: Gait normal.  Psychiatric:        Mood and Affect: Mood normal.        Behavior: Behavior normal.        Thought Content: Thought content normal.        Judgment: Judgment normal.     BP 134/73   Pulse (!) 49   Temp (!) 97.5 F (36.4 C)   Wt 197 lb 12.8 oz (89.7 kg)   SpO2 98%   BMI 27.98 kg/m  Wt Readings from Last 3 Encounters:  03/28/23 197 lb 12.8 oz (89.7 kg)  12/28/22 191 lb 12.8 oz (87 kg)  07/02/21 180 lb (81.6 kg)    No results found for: "TSH" Lab Results  Component Value Date   WBC 5.9 12/28/2022   HGB 14.2 12/28/2022   HCT 43.7 12/28/2022   MCV 88 12/28/2022   PLT 278 12/28/2022   Lab Results  Component Value Date   NA 136 12/28/2022   K 4.6 12/28/2022   CO2 23 12/28/2022   GLUCOSE 97 12/28/2022   BUN 22 12/28/2022   CREATININE 0.94 12/28/2022   BILITOT 0.3 12/28/2022   ALKPHOS 93 12/28/2022   AST 20 12/28/2022   ALT 21 12/28/2022   PROT 7.9 12/28/2022   ALBUMIN 4.8  12/28/2022   CALCIUM 10.0 12/28/2022   ANIONGAP 12 07/02/2021   EGFR 99 12/28/2022   No results found for: "CHOL" No results found for: "HDL" No results found for: "LDLCALC" No results found for: "TRIG" No results found for: "CHOLHDL" Lab Results  Component Value Date   HGBA1C 6.3 (H) 12/28/2022      Assessment & Plan:   Problem List Items Addressed This Visit       Digestive   Gastroesophageal reflux disease   Feels much better on pantoprazole 40 mg daily Continue current medication        Other   Insomnia   Home sleep study is pending Continue Vistaril 25 mg to 50 mg at bedtime as needed Start Seroquel 25 mg at bedtime Patient encouraged to report excessive sleepiness Follow-up with psychiatrist as planned      Relevant Medications   QUEtiapine (SEROQUEL) 25 MG tablet   Nausea   Patient referred to GI, continue Zofran 4 mg every 8 hours as needed       Relevant Orders   Ambulatory referral to Gastroenterology   Anxiety and depression       12/28/2022   12:29 PM  Depression screen PHQ 2/9  Decreased Interest 0  Down, Depressed, Hopeless 0  PHQ - 2 Score 0  Continue Zoloft 50 mg daily hydroxyzine 25 mg at bedtime as needed.  Starting Seroquel 25 mg at bedtime Patient encouraged to follow-up with psychiatrist as planned       Prediabetes - Primary   Lab Results  Component Value Date   HGBA1C 6.3 (H) 12/28/2022  Avoid sugar sweets soda      Right elbow pain   Toradol 30 mg injection in Solu-Medrol 40 mg injection given in the office today Start methylPREDNISolone (MEDROL DOSEPAK) 4 MG TBPK tablet tomorrow ; Take as instructed on the packaging  Dispense: 1 each; Refill: 0.  Patient encouraged to take medication with food and he was also told not to take medication with ibuprofen to prevent GI side effects.        Relevant Medications   methylPREDNISolone (MEDROL DOSEPAK) 4 MG TBPK tablet (Start on 03/29/2023)   Hernia of abdominal wall    Patient denies abdominal pain but he is worried about the hernia.  Would like referral to GI specialist.  Referral placed      Relevant Orders   Ambulatory referral to Gastroenterology   Stiffness of finger joint   Patient able to do range of motion of bilateral hands without difficulty in the office today He was encouraged to do range of motion exercises of bilateral hands daily and take ibuprofen if needed for pain.  Will refer patient to orthopedics at next visit if needed       Meds ordered this encounter  Medications   QUEtiapine (SEROQUEL) 25 MG tablet    Sig: Take 1 tablet (25 mg total) by mouth at bedtime.    Dispense:  30 tablet    Refill:  1   methylPREDNISolone (MEDROL DOSEPAK) 4 MG TBPK tablet    Sig: Take as instructed on the packaging    Dispense:  1 each    Refill:  0    Follow-up: Return in about 4 months (around 07/27/2023) for HTN.    Donell Beers, FNP

## 2023-03-28 NOTE — Assessment & Plan Note (Signed)
Patient denies abdominal pain but he is worried about the hernia.  Would like referral to GI specialist.  Referral placed

## 2023-03-28 NOTE — Assessment & Plan Note (Addendum)
     12/28/2022   12:29 PM  Depression screen PHQ 2/9  Decreased Interest 0  Down, Depressed, Hopeless 0  PHQ - 2 Score 0  Continue Zoloft 50 mg daily hydroxyzine 25 mg at bedtime as needed.  Starting Seroquel 25 mg at bedtime Patient encouraged to follow-up with psychiatrist as planned

## 2023-03-28 NOTE — Telephone Encounter (Signed)
Sleep center advised they have called pt and no answer. Pt was called to provide their number to call. [facility]

## 2023-03-28 NOTE — Patient Instructions (Addendum)
You were given Toradol 30 mg injection and Solu-Medrol 40 mg injection in the office today  From tomorrow please start taking methylprednisolone as ordered, take this medication with food and do not take the medication with ibuprofen or Aleve to prevent stomach upset  I have referred you to the gastroenterologist for your hernia and nausea  For insomnia start taking seroquel 25 mg daily at bedtime, continue hydroxyzine 25-50mg  at bedtime  Please follow-up with your psychiatrist as discussed.     It is important that you exercise regularly at least 30 minutes 5 times a week as tolerated  Think about what you will eat, plan ahead. Choose " clean, green, fresh or frozen" over canned, processed or packaged foods which are more sugary, salty and fatty. 70 to 75% of food eaten should be vegetables and fruit. Three meals at set times with snacks allowed between meals, but they must be fruit or vegetables. Aim to eat over a 12 hour period , example 7 am to 7 pm, and STOP after  your last meal of the day. Drink water,generally about 64 ounces per day, no other drink is as healthy. Fruit juice is best enjoyed in a healthy way, by EATING the fruit.  Thanks for choosing [facility] we consider it a privelige to serve you.

## 2023-03-28 NOTE — Assessment & Plan Note (Signed)
Patient referred to GI, continue Zofran 4 mg every 8 hours as needed

## 2023-03-28 NOTE — Assessment & Plan Note (Signed)
Home sleep study is pending Continue Vistaril 25 mg to 50 mg at bedtime as needed Start Seroquel 25 mg at bedtime Patient encouraged to report excessive sleepiness Follow-up with psychiatrist as planned

## 2023-03-28 NOTE — Assessment & Plan Note (Signed)
Lab Results  Component Value Date   HGBA1C 6.3 (H) 12/28/2022  Avoid sugar sweets soda

## 2023-03-28 NOTE — Telephone Encounter (Signed)
-----   Message from [place] sent at 03/28/2023 12:39 PM EST ----- Kindly follow-up on the home sleep study ordered for this patient.  Thank you

## 2023-03-28 NOTE — Assessment & Plan Note (Signed)
Feels much better on pantoprazole 40 mg daily Continue current medication

## 2023-04-09 ENCOUNTER — Other Ambulatory Visit: Payer: Self-pay | Admitting: Nurse Practitioner

## 2023-04-09 DIAGNOSIS — R110 Nausea: Secondary | ICD-10-CM

## 2023-04-15 ENCOUNTER — Other Ambulatory Visit: Payer: Self-pay | Admitting: Nurse Practitioner

## 2023-04-15 NOTE — Telephone Encounter (Signed)
Please advise KH 

## 2023-04-19 ENCOUNTER — Ambulatory Visit (HOSPITAL_BASED_OUTPATIENT_CLINIC_OR_DEPARTMENT_OTHER): Payer: Commercial Managed Care - HMO | Attending: Nurse Practitioner | Admitting: Internal Medicine

## 2023-04-20 ENCOUNTER — Encounter: Payer: Self-pay | Admitting: Gastroenterology

## 2023-05-01 ENCOUNTER — Other Ambulatory Visit: Payer: Self-pay | Admitting: Nurse Practitioner

## 2023-05-01 DIAGNOSIS — R110 Nausea: Secondary | ICD-10-CM

## 2023-05-02 NOTE — Telephone Encounter (Signed)
 Please advise [facility]

## 2023-05-03 ENCOUNTER — Ambulatory Visit: Payer: Self-pay | Admitting: Nurse Practitioner

## 2023-05-03 NOTE — Telephone Encounter (Signed)
 Copied from CRM [id number]. Topic: Clinical - Red Word Triage >> May 03, 2023 11:14 AM Curlee DEL wrote: [facility] that prompted transfer to Nurse Triage: Patient is experiencing pain in his right arm - difficulty with moving it around.   Chief Complaint: Arm pain Symptoms: Right arm pain, rash Frequency: Constant  Pertinent Negatives: Patient denies known injury, fever, numbness Disposition: [] ED /[] Urgent Care (no appt availability in office) / [x] Appointment(In office/virtual)/ []  [facility]/ [] Home Care/ [] Refused Recommended Disposition /[]  Mobile Bus/ []  Follow-up with PCP Additional Notes: Patient's girlfriend called with patient reporting that the patient has had right arm pain for the last few months that has been worsening. She states his pain is now constant and is worse with movement. Patient also has a rash on his back and to his underarms bilaterally. No appointments available in the office. Appointment made at [facility] office in the morning.     Reason for Disposition  [1] MODERATE pain (e.g., interferes with normal activities) AND [2] present > 3 days  Answer Assessment - Initial Assessment Questions 1. ONSET: When did the pain start?     2 months  2. LOCATION: Where is the pain located?     Right arm 3. PAIN: How bad is the pain? (Scale 1-10; or mild, moderate, severe)   - MILD (1-3): Doesn't interfere with normal activities.   - MODERATE (4-7): Interferes with normal activities (e.g., work or school) or awakens from sleep.   - SEVERE (8-10): Excruciating pain, unable to do any normal activities, unable to hold a cup of water.     10/10 4. WORK OR EXERCISE: Has there been any recent work or exercise that involved this part of the body?     No 5. CAUSE: What do you think is causing the arm pain?     No 6. OTHER SYMPTOMS: Do you have any other symptoms? (e.g., neck pain, swelling, rash, fever, numbness, weakness)     Rash under both arms  and on back  Protocols used: Arm Pain-A-AH

## 2023-05-04 ENCOUNTER — Encounter: Payer: Self-pay | Admitting: Family Medicine

## 2023-05-04 ENCOUNTER — Ambulatory Visit (INDEPENDENT_AMBULATORY_CARE_PROVIDER_SITE_OTHER): Payer: Commercial Managed Care - HMO

## 2023-05-04 ENCOUNTER — Ambulatory Visit (INDEPENDENT_AMBULATORY_CARE_PROVIDER_SITE_OTHER): Payer: Commercial Managed Care - HMO | Admitting: Family Medicine

## 2023-05-04 VITALS — BP 140/92 | HR 60 | Temp 98.0°F | Ht 70.5 in | Wt 200.0 lb

## 2023-05-04 DIAGNOSIS — R21 Rash and other nonspecific skin eruption: Secondary | ICD-10-CM | POA: Diagnosis not present

## 2023-05-04 DIAGNOSIS — M79601 Pain in right arm: Secondary | ICD-10-CM

## 2023-05-04 DIAGNOSIS — M25521 Pain in right elbow: Secondary | ICD-10-CM

## 2023-05-04 MED ORDER — TRIAMCINOLONE ACETONIDE 0.1 % EX CREA: 1.0000 | TOPICAL_CREAM | Freq: Two times a day (BID) | CUTANEOUS | 1 refills | Status: DC

## 2023-05-04 MED ORDER — MELOXICAM 15 MG PO TABS: 15.0000 mg | ORAL_TABLET | Freq: Every day | ORAL | 0 refills | Status: DC

## 2023-05-04 NOTE — Patient Instructions (Signed)
 We are getting an xray today. We will be in contact with any abnormal results that require further attention.  I have sent in meloxicam  for you to take once daily.  This is an anti-inflammatory.   Do not take ibuprofen , Aleve, aspirin with this medication.  You may take Tylenol  with this medication.  Eat with this medication, it can upset your stomach if you do not.  I have sent in triamcinolone  cream for you.  You may use this twice a day.  Follow-up with sports medicine, I have placed a referral today.  Follow-up with me as needed for the rash.

## 2023-05-04 NOTE — Progress Notes (Signed)
 Acute Office Visit  Subjective:     Patient ID: Danny Villa, male    DOB: 1975/07/22, 48 y.o.   MRN: [id number]  Chief Complaint  Patient presents with   Acute Visit    Arm pain ongoing for a few months, rash present for about a week, mainly on back. Not painful, very itch, has tried Calamine and benadryl no relief    HPI Rash: Patient complains of rash involving the  bilateral axilla . Rash started 2 week ago. Appearance of rash at onset: Texture of lesion(s): raised. Rash has not changed over time Initial distribution:  bilateral axilla .  Discomfort associated with rash: is pruritic.  Associated symptoms: none. Denies: none. Patient has not had previous evaluation of rash. Patient has not had previous treatment.  Response to treatment: NA. Patient has not had contacts with similar rash. Patient has identified precipitant. Patient has had new exposures (soaps, lotions, laundry detergents, foods, medications, plants, insects or animals.)  Reports right forearm and elbow pain for the last 2 months. 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 Does not recall a certain injury where he fell, or precipitating event. States that the pain has been pretty consistent to the anterior aspect of the right elbow, states that pain is worse with turning his wrist*arm from the elbow. Denies any erythema, heat, swelling, deformity. Has taken Tylenol  with little relief. Denies other concerns today.   ROS Per HPI      Objective:    BP (!) 140/92 (BP Location: Left Arm, Patient Position: Sitting, Cuff Size: Normal)   Pulse 60   Temp 98 F (36.7 C) (Oral)   Ht 5' 10.5 (1.791 m)   Wt 200 lb (90.7 kg)   SpO2 99%   BMI 28.29 kg/m    Physical Exam Vitals and nursing note reviewed.  Constitutional:      General: He is not in acute distress.    Appearance: Normal appearance.  HENT:     Head: Normocephalic and atraumatic.     Mouth/Throat:      Mouth: Mucous membranes are moist.     Pharynx: Oropharynx is clear.  Pulmonary:     Effort: Pulmonary effort is normal.  Musculoskeletal:        General: Tenderness (anterior R elbow, foerarm) present. No swelling, deformity or signs of injury.     Cervical back: Normal range of motion.  Skin:    Findings: Rash (fine raised papular rash to bilateral axilla, non tender, no discharge) present.  Neurological:     Mental Status: He is alert.    No results found for any visits on 05/04/23.      Assessment & Plan:  1. Rash (Primary)  - triamcinolone  cream (KENALOG ) 0.1 %; Apply 1 Application topically 2 (two) times daily.  Dispense: 60 g; Refill: 1  2. Right arm pain  - DG Elbow 2 Views Right; Future - meloxicam  (MOBIC ) 15 MG tablet; Take 1 tablet (15 mg total) by mouth daily.  Dispense: 30 tablet; Refill: 0 - Ambulatory referral to Sports Medicine   Meds ordered this encounter  Medications   triamcinolone  cream (KENALOG ) 0.1 %    Sig: Apply 1 Application topically 2 (two) times daily.    Dispense:  60 g    Refill:  1   meloxicam  (MOBIC ) 15 MG tablet    Sig: Take 1 tablet (15 mg total) by mouth daily.    Dispense:  30  tablet    Refill:  0    Return if symptoms worsen or fail to improve.  Corean Ku, FNP

## 2023-05-05 NOTE — Progress Notes (Signed)
   LILLETTE Ileana Collet, PhD, LAT, ATC acting as a scribe for Artist Lloyd, MD.  Danny Villa is a 48 y.o. male who presents to [facility] today for R elbow pain ongoing for about 2 months. He does not recall any injury. He works as publishing copy. Pt locates pain to the lateral aspect of his R elbow and into the distal upper arm and proximal forearm. He describes it as a deep joint pain.  Radiates: no Paresthesia: yes- into R hand/fingers Grip strength: w/ trying to twist off a bottle cap Aggravates: elbow ext, supination/pronation, twisting Treatments tried:Tylenol , IBU 800, meloxicam , prednisone  Dx imaging: 05/04/23 R elbow XR  Pertinent review of systems: No fevers or chills  Relevant historical information: Hypertension   Exam:  BP 118/74   Pulse (!) 48   Ht 5' 10.5 (1.791 m)   Wt 201 lb (91.2 kg)   SpO2 97%   BMI 28.43 kg/m  General: Well Developed, well nourished, and in no acute distress.   MSK: Right elbow normal-appearing Tender palpation lateral epicondyle.  Normal elbow motion.  Intact strength. Pain is reproduced with resisted wrist extension and grip. Pulses capillary fill and sensation are intact distally.    Lab and Radiology Results No results found for this or any previous visit (from the past 72 hours). DG Elbow 2 Views Right Result Date: 05/04/2023 CLINICAL DATA:  Elbow pain for 2 months no known injury. EXAM: RIGHT ELBOW - 2 VIEW COMPARISON:  None Available. FINDINGS: Normal bone mineralization. Normal position of the distal anterior humeral fat pad without evidence of a joint effusion. Mild degenerative spurring at the volar and medial aspects of the coronoid process. No acute fracture or dislocation. IMPRESSION: Mild degenerative spurring at the volar and medial aspects of the coronoid process. Electronically Signed   By: Tanda Lyons M.D.   On: 05/04/2023 11:20  I, Artist Lloyd, personally  (independently) visualized and performed the interpretation of the images attached in this note.      Assessment and Plan: 49 y.o. male with right lateral elbow pain due to lateral epicondylitis.  He already has had a trial of oral prednisone which was not effective.  Plan for trial of home exercise program taught in clinic today by ATC.  Also recommend tennis elbow strap and a wrist brace.  If not improving in a few weeks to let me know and I can order hand therapy.  Recheck in about a month.  Precautions reviewed.   PDMP not reviewed this encounter. No orders of the defined types were placed in this encounter.  No orders of the defined types were placed in this encounter.    Discussed warning signs or symptoms. Please see discharge instructions. Patient expresses understanding.   The above documentation has been reviewed and is accurate and complete Artist Lloyd, M.D.

## 2023-05-06 ENCOUNTER — Other Ambulatory Visit: Payer: Self-pay

## 2023-05-06 ENCOUNTER — Ambulatory Visit (INDEPENDENT_AMBULATORY_CARE_PROVIDER_SITE_OTHER): Payer: Commercial Managed Care - HMO | Admitting: Family Medicine

## 2023-05-06 VITALS — BP 118/74 | HR 48 | Ht 70.5 in | Wt 201.0 lb

## 2023-05-06 DIAGNOSIS — M7711 Lateral epicondylitis, right elbow: Secondary | ICD-10-CM

## 2023-05-06 NOTE — Patient Instructions (Addendum)
 Thank you for coming in today.   Please work on the home exercises the athletic trainer went over with you:  View at [email or web address]-[email or web address] using code: [email or web address]  Tennis elbow strap and/or wrist brace  Please use Voltaren gel (Generic Diclofenac Gel) up to 4x daily for pain as needed.  This is available over-the-counter as both the name brand Voltaren gel and the generic diclofenac gel.   Check back in 1 month

## 2023-05-08 ENCOUNTER — Other Ambulatory Visit: Payer: Self-pay | Admitting: Nurse Practitioner

## 2023-05-08 DIAGNOSIS — K219 Gastro-esophageal reflux disease without esophagitis: Secondary | ICD-10-CM

## 2023-05-08 DIAGNOSIS — G4700 Insomnia, unspecified: Secondary | ICD-10-CM

## 2023-05-09 NOTE — Telephone Encounter (Signed)
 Please advise [facility]

## 2023-05-15 ENCOUNTER — Other Ambulatory Visit: Payer: Self-pay | Admitting: Nurse Practitioner

## 2023-05-15 DIAGNOSIS — G4700 Insomnia, unspecified: Secondary | ICD-10-CM

## 2023-05-15 DIAGNOSIS — R110 Nausea: Secondary | ICD-10-CM

## 2023-05-16 NOTE — Telephone Encounter (Signed)
 Please advise [facility]

## 2023-05-23 ENCOUNTER — Other Ambulatory Visit: Payer: Self-pay | Admitting: Family Medicine

## 2023-05-23 DIAGNOSIS — M79601 Pain in right arm: Secondary | ICD-10-CM

## 2023-05-25 NOTE — Progress Notes (Unsigned)
 Chief Complaint:nausea, hernia Primary GI Doctor:unassigned  HPI:  Patient is a  48  year old male patient with past medical history of Anxiety, Depression, GERD, Hypertension, *****who was referred to me by Donell Beers, FNP on 03/28/23 for a complaint of nausea, hernia .    Interval History  Patient admits/denies GERD Patient taking Pantoprazole 40 mg po daily Patient admits/denies dysphagia Patient admits/denies nausea, vomiting, or weight loss Patient has prescription for Zofran 4 mg every 8 hrs prn.  Patient admits/denies altered bowel habits Patient told by previous doctor he has hernia? *** Patient admits/denies abdominal pain Patient admits/denies rectal bleeding   Denies/Admits alcohol Denies/Admits smoking Denies/Admits NSAID use. Patient taking meloxicam and ibuprofen? *** Denies/Admits they are on blood thinners.  Patients last colonoscopy Patients last EGD  Patient's family history includes  Wt Readings from Last 3 Encounters:  05/06/23 201 lb (91.2 kg)  05/04/23 200 lb (90.7 kg)  03/28/23 197 lb 12.8 oz (89.7 kg)      Past Medical History:  Diagnosis Date   Anxiety    Depression    GERD (gastroesophageal reflux disease)    Hypertension    Suicide attempt (HCC) 03/2014   ingested pills    No past surgical history on file.  Current Outpatient Medications  Medication Sig Dispense Refill   hydrOXYzine (VISTARIL) 25 MG capsule TAKE 1 CAPSULE(25 MG) BY MOUTH AT BEDTIME AS NEEDED 30 capsule 2   ibuprofen (ADVIL) 200 MG tablet Take 200-400 mg by mouth every 6 (six) hours as needed for headache or mild pain.     meloxicam (MOBIC) 15 MG tablet Take 1 tablet (15 mg total) by mouth daily. 30 tablet 0   ondansetron (ZOFRAN) 4 MG tablet TAKE 1 TABLET(4 MG) BY MOUTH EVERY 8 HOURS AS NEEDED FOR NAUSEA OR VOMITING 20 tablet 0   pantoprazole (PROTONIX) 40 MG tablet TAKE 1 TABLET(40 MG) BY MOUTH DAILY 30 tablet 3   QUEtiapine (SEROQUEL) 25 MG tablet TAKE  1 TABLET(25 MG) BY MOUTH AT BEDTIME 30 tablet 1   sertraline (ZOLOFT) 50 MG tablet TAKE 1 TABLET BY MOUTH DAILY 30 tablet 1   triamcinolone cream (KENALOG) 0.1 % Apply 1 Application topically 2 (two) times daily. 60 g 1   valsartan-hydrochlorothiazide (DIOVAN-HCT) 80-12.5 MG tablet Take 1 tablet by mouth daily. 90 tablet 1   No current facility-administered medications for this visit.    Allergies as of 05/26/2023 - Review Complete 05/06/2023  Allergen Reaction Noted   Shellfish allergy Anaphylaxis, Itching, and Swelling 03/03/2019    Family History  Problem Relation Age of Onset   Mental illness Mother    Colon cancer Neg Hx     Review of Systems:    Constitutional: No weight loss, fever, chills, weakness or fatigue HEENT: Eyes: No change in vision               Ears, Nose, Throat:  No change in hearing or congestion Skin: No rash or itching Cardiovascular: No chest pain, chest pressure or palpitations   Respiratory: No SOB or cough Gastrointestinal: See HPI and otherwise negative Genitourinary: No dysuria or change in urinary frequency Neurological: No headache, dizziness or syncope Musculoskeletal: No new muscle or joint pain Hematologic: No bleeding or bruising Psychiatric: No history of depression or anxiety    Physical Exam:  Vital signs: There were no vitals taken for this visit.  Constitutional:   Pleasant Caucasian male*** appears to be in NAD, Well developed, Well nourished, alert and cooperative  Head:  Normocephalic and atraumatic. Eyes:   PEERL, EOMI. No icterus. Conjunctiva pink. Ears:  Normal auditory acuity. Neck:  Supple Throat: Oral cavity and pharynx without inflammation, swelling or lesion.  Respiratory: Respirations even and unlabored. Lungs clear to auscultation bilaterally.   No wheezes, crackles, or rhonchi.  Cardiovascular: Normal S1, S2. Regular rate and rhythm. No peripheral edema, cyanosis or pallor.  Gastrointestinal:  Soft, nondistended,  nontender. No rebound or guarding. Normal bowel sounds. No appreciable masses or hepatomegaly. Rectal:  Not performed.  Anoscopy: Msk:  Symmetrical without gross deformities. Without edema, no deformity or joint abnormality.  Neurologic:  Alert and  oriented x4;  grossly normal neurologically.  Skin:   Dry and intact without significant lesions or rashes. Psychiatric: Oriented to person, place and time. Demonstrates good judgement and reason without abnormal affect or behaviors.  RELEVANT LABS AND IMAGING: CBC    Latest Ref Rng & Units 12/28/2022   12:31 PM 07/02/2021    1:43 AM 04/17/2016   12:18 AM  CBC  WBC 3.4 - 10.8 x10E3/uL 5.9  7.7  10.4   Hemoglobin 13.0 - 17.7 g/dL 40.9  81.1  91.4   Hematocrit 37.5 - 51.0 % 43.7  43.2  46.0   Platelets 150 - 450 x10E3/uL 278  275  301      CMP     Latest Ref Rng & Units 12/28/2022   12:31 PM 07/02/2021    1:43 AM 04/17/2016   12:18 AM  CMP  Glucose 70 - 99 mg/dL 97  85  782   BUN 6 - 24 mg/dL 22  15  11    Creatinine 0.76 - 1.27 mg/dL 9.56  2.13  0.86   Sodium 134 - 144 mmol/L 136  138  138   Potassium 3.5 - 5.2 mmol/L 4.6  4.0  3.1   Chloride 96 - 106 mmol/L 99  106  99   CO2 20 - 29 mmol/L 23  20  25    Calcium 8.7 - 10.2 mg/dL 57.8  9.7  46.9   Total Protein 6.0 - 8.5 g/dL 7.9  7.8  8.9   Total Bilirubin 0.0 - 1.2 mg/dL 0.3  0.6  0.7   Alkaline Phos 44 - 121 IU/L 93  68  73   AST 0 - 40 IU/L 20  18  30    ALT 0 - 44 IU/L 21  30  53      No results found for: "TSH"   Assessment: 1. ***  Plan: 1. ***   Thank you for the courtesy of this consult. Please call me with any questions or concerns.   Kenneth Lax, FNP-C [facility] 05/25/2023, 10:25 AM  Cc: Donell Beers, FNP

## 2023-05-26 ENCOUNTER — Ambulatory Visit (INDEPENDENT_AMBULATORY_CARE_PROVIDER_SITE_OTHER): Payer: Medicaid Other | Admitting: Gastroenterology

## 2023-05-26 ENCOUNTER — Encounter: Payer: Self-pay | Admitting: Gastroenterology

## 2023-05-26 VITALS — BP 130/70 | Ht 73.0 in | Wt 204.0 lb

## 2023-05-26 DIAGNOSIS — R194 Change in bowel habit: Secondary | ICD-10-CM | POA: Diagnosis not present

## 2023-05-26 DIAGNOSIS — K219 Gastro-esophageal reflux disease without esophagitis: Secondary | ICD-10-CM | POA: Diagnosis not present

## 2023-05-26 DIAGNOSIS — K429 Umbilical hernia without obstruction or gangrene: Secondary | ICD-10-CM

## 2023-05-26 DIAGNOSIS — R110 Nausea: Secondary | ICD-10-CM | POA: Diagnosis not present

## 2023-05-26 DIAGNOSIS — K625 Hemorrhage of anus and rectum: Secondary | ICD-10-CM | POA: Diagnosis not present

## 2023-05-26 MED ORDER — PANTOPRAZOLE SODIUM 40 MG PO TBEC: 40.0000 mg | DELAYED_RELEASE_TABLET | Freq: Every day | ORAL | 3 refills | Status: DC

## 2023-05-26 MED ORDER — SUTAB 1479-225-188 MG PO TABS: ORAL_TABLET | ORAL | 0 refills | Status: DC

## 2023-05-26 NOTE — Patient Instructions (Addendum)
 Stop NSAID use (Ibuprofen). Continue Pantoprazole 40 mg po 30 minutes before breakfast. Take Ondansetron 4 mg three times daily before meals.  You have been scheduled for an endoscopy and colonoscopy. Please follow the written instructions given to you at your visit today.  If you use inhalers (even only as needed), please bring them with you on the day of your procedure.  DO NOT TAKE 7 DAYS PRIOR TO TEST- Trulicity (dulaglutide) Ozempic, Wegovy (semaglutide) Mounjaro (tirzepatide) Bydureon Bcise (exanatide extended release)  DO NOT TAKE 1 DAY PRIOR TO YOUR TEST Rybelsus (semaglutide) Adlyxin (lixisenatide) Victoza (liraglutide) Byetta (exanatide) ___________________________________________________________________________  Bonita Quin will receive your bowel preparation through [facility], which ensures the lowest copay and home delivery, with outreach via text or call from an [phone] number. Please respond promptly to avoid rescheduling of your procedure. If you are interested in alternative options or have any questions regarding your prep, please contact them at [phone] ____________________________________________________________________________  Your Provider Has Sent Your Bowel Prep Regimen To [facility]   [facility] will contact you to verify your information and collect your copay, if applicable. Enjoy the comfort of your home while your prescription is mailed to you, FREE of any shipping charges.   [facility] accepts all major insurance benefits and applies discounts & coupons.  Have additional questions?   Chat: [email or web address] Call: [phone] Email: [email or web address] NCPDP: [id number]  How will [facility] contact you?  With a Welcome phone call,  a Welcome text and a checkout link in text form.  Texts you receive from [phone] Are NOT Spam.  *To set up delivery, you must complete the checkout process via link or speak to one of the patient care  representatives. If [facility] is unable to reach you, your prescription may be delayed.  To avoid long hold times on the phone, you may also utilize the secure chat feature on the [facility] website to request that they call you back for transaction completion or to expedite your concerns.   _______________________________________________________  If your blood pressure at your visit was 140/90 or greater, please contact your primary care physician to follow up on this.  _______________________________________________________  If you are age 50 or older, your body mass index should be between 23-30. Your Body mass index is 26.91 kg/m. If this is out of the aforementioned range listed, please consider follow up with your Primary Care Provider.  If you are age 108 or younger, your body mass index should be between 19-25. Your Body mass index is 26.91 kg/m. If this is out of the aformentioned range listed, please consider follow up with your Primary Care Provider.   ________________________________________________________  The [facility] providers would like to encourage you to use [facility] to communicate with providers for non-urgent requests or questions.  Due to long hold times on the telephone, sending your provider a message by [facility] may be a faster and more efficient way to get a response.  Please allow 48 business hours for a response.  Please remember that this is for non-urgent requests.  _______________________________________________________

## 2023-05-27 NOTE — Progress Notes (Signed)
 Agree with assessment/plan.  Edman Circle, MD [facility] [phone]

## 2023-06-02 ENCOUNTER — Other Ambulatory Visit: Payer: Self-pay | Admitting: Nurse Practitioner

## 2023-06-02 ENCOUNTER — Other Ambulatory Visit: Payer: Self-pay | Admitting: Family Medicine

## 2023-06-02 DIAGNOSIS — M79601 Pain in right arm: Secondary | ICD-10-CM

## 2023-06-02 DIAGNOSIS — R110 Nausea: Secondary | ICD-10-CM

## 2023-06-02 NOTE — Progress Notes (Deleted)
   Rubin Payor, PhD, LAT, ATC acting as a scribe for Clementeen Graham, MD.  Danny Villa is a 48 y.o. 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 Pt was last seen by Dr. Denyse Amass on 05/06/23 and was advised to use an elbow strap, wrist brace, and was taught HEP.  Today, pt reports ***  Dx imaging: 05/04/23 R elbow XR   Pertinent review of systems: ***  Relevant historical information: ***   Exam:  There were no vitals taken for this visit. General: Well Developed, well nourished, and in no acute distress.   MSK: ***    Lab and Radiology Results No results found for this or any previous visit (from the past 72 hours). No results found.     Assessment and Plan: 48 y.o. male with ***   PDMP not reviewed this encounter. No orders of the defined types were placed in this encounter.  No orders of the defined types were placed in this encounter.    Discussed warning signs or symptoms. Please see discharge instructions. Patient expresses understanding.   ***

## 2023-06-02 NOTE — Telephone Encounter (Signed)
 Please advise [facility]

## 2023-06-03 ENCOUNTER — Ambulatory Visit: Payer: Commercial Managed Care - HMO | Admitting: Family Medicine

## 2023-06-13 ENCOUNTER — Other Ambulatory Visit: Payer: Self-pay | Admitting: Nurse Practitioner

## 2023-06-13 DIAGNOSIS — R110 Nausea: Secondary | ICD-10-CM

## 2023-06-13 MED ORDER — ONDANSETRON HCL 4 MG PO TABS: 4.0000 mg | ORAL_TABLET | Freq: Three times a day (TID) | ORAL | 0 refills | Status: DC | PRN

## 2023-06-13 NOTE — Telephone Encounter (Signed)
 Please advise [facility]

## 2023-06-16 ENCOUNTER — Other Ambulatory Visit: Payer: Self-pay

## 2023-06-16 DIAGNOSIS — R110 Nausea: Secondary | ICD-10-CM

## 2023-06-16 NOTE — Telephone Encounter (Signed)
 Please advise [facility]

## 2023-06-17 ENCOUNTER — Other Ambulatory Visit: Payer: Self-pay | Admitting: Nurse Practitioner

## 2023-06-17 DIAGNOSIS — I10 Essential (primary) hypertension: Secondary | ICD-10-CM

## 2023-06-17 MED ORDER — ONDANSETRON HCL 4 MG PO TABS: 4.0000 mg | ORAL_TABLET | Freq: Three times a day (TID) | ORAL | 0 refills | Status: DC | PRN

## 2023-06-26 ENCOUNTER — Other Ambulatory Visit: Payer: Self-pay | Admitting: Nurse Practitioner

## 2023-06-26 DIAGNOSIS — G4700 Insomnia, unspecified: Secondary | ICD-10-CM

## 2023-07-10 ENCOUNTER — Other Ambulatory Visit: Payer: Self-pay | Admitting: Nurse Practitioner

## 2023-07-10 DIAGNOSIS — R110 Nausea: Secondary | ICD-10-CM

## 2023-07-27 ENCOUNTER — Ambulatory Visit: Payer: Self-pay | Admitting: Nurse Practitioner

## 2023-07-31 ENCOUNTER — Other Ambulatory Visit: Payer: Self-pay | Admitting: Nurse Practitioner

## 2023-07-31 DIAGNOSIS — R110 Nausea: Secondary | ICD-10-CM

## 2023-08-02 ENCOUNTER — Ambulatory Visit: Payer: Medicaid Other | Admitting: Gastroenterology

## 2023-08-02 ENCOUNTER — Encounter: Payer: Self-pay | Admitting: Gastroenterology

## 2023-08-02 VITALS — BP 107/81 | HR 51 | Temp 98.1°F | Resp 16 | Ht 73.0 in | Wt 204.0 lb

## 2023-08-02 DIAGNOSIS — K2950 Unspecified chronic gastritis without bleeding: Secondary | ICD-10-CM

## 2023-08-02 DIAGNOSIS — K31A19 Gastric intestinal metaplasia without dysplasia, unspecified site: Secondary | ICD-10-CM | POA: Diagnosis not present

## 2023-08-02 DIAGNOSIS — K219 Gastro-esophageal reflux disease without esophagitis: Secondary | ICD-10-CM

## 2023-08-02 DIAGNOSIS — K640 First degree hemorrhoids: Secondary | ICD-10-CM

## 2023-08-02 DIAGNOSIS — K621 Rectal polyp: Secondary | ICD-10-CM

## 2023-08-02 DIAGNOSIS — R194 Change in bowel habit: Secondary | ICD-10-CM

## 2023-08-02 DIAGNOSIS — D128 Benign neoplasm of rectum: Secondary | ICD-10-CM

## 2023-08-02 DIAGNOSIS — K5730 Diverticulosis of large intestine without perforation or abscess without bleeding: Secondary | ICD-10-CM | POA: Diagnosis not present

## 2023-08-02 DIAGNOSIS — R110 Nausea: Secondary | ICD-10-CM

## 2023-08-02 DIAGNOSIS — K625 Hemorrhage of anus and rectum: Secondary | ICD-10-CM

## 2023-08-02 MED ORDER — SODIUM CHLORIDE 0.9 % IV SOLN: 500.0000 mL | Freq: Once | INTRAVENOUS | Status: DC

## 2023-08-02 MED ORDER — HYDROCORTISONE (PERIANAL) 2.5 % EX CREA: 1.0000 | TOPICAL_CREAM | Freq: Two times a day (BID) | CUTANEOUS | 1 refills | Status: DC | PRN

## 2023-08-02 NOTE — Progress Notes (Unsigned)
 Pt's states no medical or surgical changes since previsit or office visit.

## 2023-08-02 NOTE — Patient Instructions (Addendum)
 On teaspoon Metamucil recommended daily   YOU HAD AN ENDOSCOPIC PROCEDURE TODAY AT THE [facility]:   Refer to the procedure report that was given to you for any specific questions about what was found during the examination.  If the procedure report does not answer your questions, please call your gastroenterologist to clarify.  If you requested that your care partner not be given the details of your procedure findings, then the procedure report has been included in a sealed envelope for you to review at your convenience later.  YOU SHOULD EXPECT: Some feelings of bloating in the abdomen. Passage of more gas than usual.  Walking can help get rid of the air that was put into your GI tract during the procedure and reduce the bloating. If you had a lower endoscopy (such as a colonoscopy or flexible sigmoidoscopy) you may notice spotting of blood in your stool or on the toilet paper. If you underwent a bowel prep for your procedure, you may not have a normal bowel movement for a few days.  Please Note:  You might notice some irritation and congestion in your nose or some drainage.  This is from the oxygen used during your procedure.  There is no need for concern and it should clear up in a day or so.  SYMPTOMS TO REPORT IMMEDIATELY:  Following lower endoscopy (colonoscopy or flexible sigmoidoscopy):  Excessive amounts of blood in the stool  Significant tenderness or worsening of abdominal pains  Swelling of the abdomen that is new, acute  Fever of 100F or higher  Following upper endoscopy (EGD)  Vomiting of blood or coffee ground material  New chest pain or pain under the shoulder blades  Painful or persistently difficult swallowing  New shortness of breath  Fever of 100F or higher  Black, tarry-looking stools  For urgent or emergent issues, a gastroenterologist can be reached at any hour by calling [phone]. Do not use MyChart messaging for urgent concerns.    DIET:  We  do recommend a small meal at first, but then you may proceed to your regular diet.  Drink plenty of fluids but you should avoid alcoholic beverages for 24 hours.  ACTIVITY:  You should plan to take it easy for the rest of today and you should NOT DRIVE or use heavy machinery until tomorrow (because of the sedation medicines used during the test).    FOLLOW UP: Our staff will call the number listed on your records the next business day following your procedure.  We will call around 7:15- 8:00 am to check on you and address any questions or concerns that you may have regarding the information given to you following your procedure. If we do not reach you, we will leave a message.     If any biopsies were taken you will be contacted by phone or by letter within the next 1-3 weeks.  Please call us  at [phone] if you have not heard about the biopsies in 3 weeks.    SIGNATURES/CONFIDENTIALITY: You and/or your care partner have signed paperwork which will be entered into your electronic medical record.  These signatures attest to the fact that that the information above on your After Visit Summary has been reviewed and is understood.  Full responsibility of the confidentiality of this discharge information lies with you and/or your care-partner.

## 2023-08-02 NOTE — Op Note (Signed)
 [facility] Patient Name: Danny Villa Procedure Date: 08/02/2023 8:03 AM MRN: [id number] Endoscopist: Lajuan Pila , MD, [id number] Age: 48 Referring MD:  Date of Birth: 06/15/1975 Gender: Male Account #: [id number] Procedure:                Upper GI endoscopy Indications:              Epigastric abdominal pain/GERD Medicines:                Monitored Anesthesia Care Procedure:                Pre-Anesthesia Assessment:                           - Prior to the procedure, a History and Physical                            was performed, and patient medications and                            allergies were reviewed. The patient's tolerance of                            previous anesthesia was also reviewed. The risks                            and benefits of the procedure and the sedation                            options and risks were discussed with the patient.                            All questions were answered, and informed consent                            was obtained. Prior Anticoagulants: The patient has                            taken no anticoagulant or antiplatelet agents. ASA                            Grade Assessment: II - A patient with mild systemic                            disease. After reviewing the risks and benefits,                            the patient was deemed in satisfactory condition to                            undergo the procedure.                           After obtaining informed consent, the endoscope was  passed under direct vision. Throughout the                            procedure, the patient's blood pressure, pulse, and                            oxygen saturations were monitored continuously. The                            Olympus Scope SN [id number] was introduced through the                            mouth, and advanced to the second part of duodenum.                            The upper GI endoscopy  was accomplished without                            difficulty. The patient tolerated the procedure                            well. Scope In: Scope Out: Findings:                 The examined esophagus was normal. Biopsies were                            obtained from the proximal and distal esophagus                            with cold forceps for histology of suspected                            eosinophilic esophagitis.                           The Z-line was regular and was found 40 cm from the                            incisors.                           Diffuse moderate inflammation characterized by                            erosions, erythema and granularity was found in the                            gastric body and in the gastric antrum. Biopsies                            were taken with a cold forceps for histology.                           A single 6 mm mucosal nodule with a  localized                            distribution was found in the D1/D2 jn. Biopsies                            were taken with a cold forceps for histology.                           The exam was otherwise without abnormality. Complications:            No immediate complications. Estimated Blood Loss:     Estimated blood loss: none. Impression:               - Moderate gastritis. Biopsied.                           - Mucosal nodule (likely inflammatory) found in the                            duodenum. Biopsied.                           - The examination was otherwise normal. Recommendation:           - Patient has a contact number available for                            emergencies. The signs and symptoms of potential                            delayed complications were discussed with the                            patient. Return to normal activities tomorrow.                            Written discharge instructions were provided to the                            patient.                            - Resume previous diet.                           - Continue present medications including Protonix                             40 mg p.o. daily.                           - Avoid nonsteroidals.                           - Await pathology results.                           -  The findings and recommendations were discussed                            with the patient's GF. If still with problems,                            further workup. Lajuan Pila, MD 08/02/2023 8:41:25 AM This report has been signed electronically.

## 2023-08-02 NOTE — Progress Notes (Unsigned)
 Sedate, gd SR, tolerated procedure well, VSS, report to RN

## 2023-08-02 NOTE — Progress Notes (Unsigned)
 Expand All Collapse All     Chief Complaint:nausea, hernia Primary GI Doctor: Dr. Venice Gillis   HPI:  Patient is a 48 year old African American male patient with past medical history of Anxiety, Depression, GERD, Hypertension, who was referred to me by Paseda, Folashade R, FNP on 03/28/23 for a complaint of nausea and hernia .     Interval History    Patient presents with main complaint of nausea for the past 1.5 years. He will take ondansetron  twice daily before breakfast and dinner which helps but he will get nauseated mid day around lunchtime. No known cause of the nausea. Can happen with and without food. No vomiting. Appetite good. Weight stable. Patient has history of GERD and taking Pantoprazole  40 mg po daily which controls his symptoms. Patient denies dysphagia.  He has two bowel movements daily. He has noted pink tinge in stool, intermittently. He mentions he has umbilical hernia, currently not causing issues.  Socially drinks. Former smoker, stopped 1 year ago. He was taking Ibuprofen  800 mg prn for pain. He recently was switched to Meloxicam  which he hasn't started. Patient has never had EGD/colonoscopy. Family history not known.      Wt Readings from Last 3 Encounters:  05/26/23 204 lb (92.5 kg)  05/06/23 201 lb (91.2 kg)  05/04/23 200 lb (90.7 kg)        Past Medical History:  Diagnosis Date   Anxiety     Depression     GERD (gastroesophageal reflux disease)     Hypertension     Suicide attempt (HCC) 03/2014    ingested pills        History reviewed. No pertinent surgical history.             Current Outpatient Medications  Medication Sig Dispense Refill   hydrOXYzine  (VISTARIL ) 25 MG capsule TAKE 1 CAPSULE(25 MG) BY MOUTH AT BEDTIME AS NEEDED 30 capsule 2   ibuprofen  (ADVIL ) 200 MG tablet Take 200-400 mg by mouth every 6 (six) hours as needed for headache or mild pain.       meloxicam  (MOBIC ) 15 MG tablet Take 1 tablet (15 mg total) by mouth daily. 30 tablet 0    ondansetron  (ZOFRAN ) 4 MG tablet TAKE 1 TABLET(4 MG) BY MOUTH EVERY 8 HOURS AS NEEDED FOR NAUSEA OR VOMITING 20 tablet 0   pantoprazole  (PROTONIX ) 40 MG tablet TAKE 1 TABLET(40 MG) BY MOUTH DAILY 30 tablet 3   sertraline (ZOLOFT) 50 MG tablet TAKE 1 TABLET BY MOUTH DAILY 30 tablet 1   valsartan -hydrochlorothiazide  (DIOVAN -HCT) 80-12.5 MG tablet Take 1 tablet by mouth daily. 90 tablet 1      No current facility-administered medications for this visit.           Allergies as of 05/26/2023 - Review Complete 05/26/2023  Allergen Reaction Noted   Shellfish allergy Anaphylaxis, Itching, and Swelling 03/03/2019         Family History  Problem Relation Age of Onset   Mental illness Mother     Colon cancer Neg Hx          Review of Systems:    Constitutional: No weight loss, fever, chills, weakness or fatigue HEENT: Eyes: No change in vision               Ears, Nose, Throat:  No change in hearing or congestion Skin: No rash or itching Cardiovascular: No chest pain, chest pressure or palpitations   Respiratory: No SOB or cough Gastrointestinal: See HPI and  otherwise negative Genitourinary: No dysuria or change in urinary frequency Neurological: No headache, dizziness or syncope Musculoskeletal: No new muscle or joint pain Hematologic: No bleeding or bruising Psychiatric: No history of depression or anxiety      Physical Exam:  Vital signs: BP 130/70   Ht 6\' 1"  (1.854 m)   Wt 204 lb (92.5 kg)   BMI 26.91 kg/m    Constitutional:   Pleasant African American male appears to be in NAD, Well developed, Well nourished, alert and cooperative Throat: Oral cavity and pharynx without inflammation, swelling or lesion.  Respiratory: Respirations even and unlabored. Lungs clear to auscultation bilaterally.   No wheezes, crackles, or rhonchi.  Cardiovascular: Normal S1, S2. Regular rate and rhythm. No peripheral edema, cyanosis or pallor.  Gastrointestinal:  Soft, nondistended, nontender. No  rebound or guarding. Normal bowel sounds. No appreciable masses or hepatomegaly. Umbilical hernia noted. Rectal:  Not performed.  Msk:  Symmetrical without gross deformities. Without edema, no deformity or joint abnormality.  Neurologic:  Alert and  oriented x4;  grossly normal neurologically.  Skin:   Dry and intact without significant lesions or rashes. Psychiatric: Oriented to person, place and time. Demonstrates good judgement and reason without abnormal affect or behaviors.   RELEVANT LABS AND IMAGING: CBC     Latest Ref Rng & Units 12/28/2022   12:31 PM 07/02/2021    1:43 AM 04/17/2016   12:18 AM  CBC  WBC 3.4 - 10.8 x10E3/uL 5.9  7.7  10.4   Hemoglobin 13.0 - 17.7 g/dL 16.1  09.6  04.5   Hematocrit 37.5 - 51.0 % 43.7  43.2  46.0   Platelets 150 - 450 x10E3/uL 278  275  301     CMP         Latest Ref Rng & Units 12/28/2022   12:31 PM 07/02/2021    1:43 AM 04/17/2016   12:18 AM  CMP  Glucose 70 - 99 mg/dL 97  85  409   BUN 6 - 24 mg/dL 22  15  11    Creatinine 0.76 - 1.27 mg/dL 8.11  9.14  7.82   Sodium 134 - 144 mmol/L 136  138  138   Potassium 3.5 - 5.2 mmol/L 4.6  4.0  3.1   Chloride 96 - 106 mmol/L 99  106  99   CO2 20 - 29 mmol/L 23  20  25    Calcium 8.7 - 10.2 mg/dL 95.6  9.7  21.3   Total Protein 6.0 - 8.5 g/dL 7.9  7.8  8.9   Total Bilirubin 0.0 - 1.2 mg/dL 0.3  0.6  0.7   Alkaline Phos 44 - 121 IU/L 93  68  73   AST 0 - 40 IU/L 20  18  30    ALT 0 - 44 IU/L 21  30  53     Assessment:     Encounter Diagnoses  Name Primary?   Gastroesophageal reflux disease, unspecified whether esophagitis present Yes   Nausea without vomiting     Altered bowel habits     Rectal bleeding     Umbilical hernia without obstruction and without gangrene     48 year old [place] American male patient with main complaint of nausea. He does have history of NSAID use and recommended he stop the Ibuprofen . Will schedule upper GI endoscopy to rule out gastritis, PUD, and/or H pylori. He will  continue PPI therapy once daily. Will have him take the zofran  TID to see if  it reduces his mid day symptoms.   For the altered stools and intermittent blood in stool will go ahead and proceed with colonoscopy. The umbilical hernia is not causing him any issues and therefore he does not wish to pursue it further.    Plan: -Will have patient take scheduled Zofran  TID before meals -Continue Pantoprazole  40 mg po daily, refill  - Schedule EGD in [facility] with Dr. Venice Gillis. The risks and benefits of EGD with possible biopsies and esophageal dilation were discussed with the patient who agrees to proceed.  - Schedule a colonoscopy. The risks and benefits of colonoscopy with possible polypectomy / biopsies were discussed and the patient agrees to proceed.    Thank you for the courtesy of this consult. Please call me with any questions or concerns.    Deanna May, FNP-C       Attending physician's note   I have taken history, reviewed the chart and examined the patient. I performed a substantive portion of this encounter, including complete performance of at least one of the key components, in conjunction with the APP. I agree with the Advanced Practitioner's note, impression and recommendations.   EGD/colon today protonix    Magnus Schuller, MD [facility] [phone]

## 2023-08-02 NOTE — Op Note (Signed)
 [facility] Patient Name: Danny Villa Procedure Date: 08/02/2023 8:03 AM MRN: [id number] Endoscopist: Lajuan Pila , MD, [id number] Age: 48 Referring MD:  Date of Birth: 1975/08/30 Gender: Male Account #: [id number] Procedure:                Colonoscopy Indications:              Rectal bleeding Medicines:                Monitored Anesthesia Care Procedure:                Pre-Anesthesia Assessment:                           - Prior to the procedure, a History and Physical                            was performed, and patient medications and                            allergies were reviewed. The patient's tolerance of                            previous anesthesia was also reviewed. The risks                            and benefits of the procedure and the sedation                            options and risks were discussed with the patient.                            All questions were answered, and informed consent                            was obtained. Prior Anticoagulants: The patient has                            taken no anticoagulant or antiplatelet agents. ASA                            Grade Assessment: II - A patient with mild systemic                            disease. After reviewing the risks and benefits,                            the patient was deemed in satisfactory condition to                            undergo the procedure.                           After obtaining informed consent, the colonoscope  was passed under direct vision. Throughout the                            procedure, the patient's blood pressure, pulse, and                            oxygen saturations were monitored continuously. The                            Olympus Scope SN: [id number] was introduced through                            the anus and advanced to the 4 cm into the ileum.                            The colonoscopy was performed without  difficulty.                            The patient tolerated the procedure well. The                            quality of the bowel preparation was good. The                            terminal ileum, ileocecal valve, appendiceal                            orifice, and rectum were photographed. Scope In: 8:22:16 AM Scope Out: 8:37:42 AM Scope Withdrawal Time: 0 hours 12 minutes 30 seconds  Total Procedure Duration: 0 hours 15 minutes 26 seconds  Findings:                 A 6 mm polyp was found in the rectum. The polyp was                            sessile. The polyp was removed with a cold snare.                            Resection and retrieval were complete.                           A few medium-mouthed diverticula were found in the                            sigmoid colon and ascending colon.                           Non-bleeding internal hemorrhoids were found during                            retroflexion. The hemorrhoids were small and Grade                            I (internal hemorrhoids  that do not prolapse).                           The terminal ileum appeared normal.                           The exam was otherwise without abnormality on                            direct and retroflexion views. Complications:            No immediate complications. Estimated Blood Loss:     Estimated blood loss: none. Impression:               - One 6 mm polyp in the rectum, removed with a cold                            snare. Resected and retrieved.                           - Mild pancolonic diverticulosis.                           - Non-bleeding internal hemorrhoids.                           - The examined portion of the ileum was normal.                           - The examination was otherwise normal on direct                            and retroflexion views. Recommendation:           - Patient has a contact number available for                            emergencies. The  signs and symptoms of potential                            delayed complications were discussed with the                            patient. Return to normal activities tomorrow.                            Written discharge instructions were provided to the                            patient.                           - High fiber diet.                           - Use original regular Metamucil one teaspoon PO  daily.                           - Use HC Cream 2.5%: Apply externally BID PRN.                           - Await pathology results.                           - Repeat colonoscopy for surveillance based on                            pathology results.                           - The findings and recommendations were discussed                            with the patient's GF. Lajuan Pila, MD 08/02/2023 8:46:34 AM This report has been signed electronically.

## 2023-08-02 NOTE — Progress Notes (Unsigned)
 Called to room to assist during endoscopic procedure.  Patient ID and intended procedure confirmed with present staff. Received instructions for my participation in the procedure from the performing physician.

## 2023-08-03 ENCOUNTER — Telehealth: Payer: Self-pay

## 2023-08-03 NOTE — Telephone Encounter (Signed)
 Left message on answering machine.

## 2023-08-04 LAB — SURGICAL PATHOLOGY

## 2023-08-09 ENCOUNTER — Ambulatory Visit: Payer: Self-pay | Admitting: Gastroenterology

## 2023-08-14 ENCOUNTER — Other Ambulatory Visit: Payer: Self-pay | Admitting: Nurse Practitioner

## 2023-08-15 NOTE — Telephone Encounter (Signed)
 Please advise [facility]

## 2023-08-16 ENCOUNTER — Telehealth: Payer: Self-pay | Admitting: Gastroenterology

## 2023-08-16 NOTE — Telephone Encounter (Signed)
 Patient called and stated that he is still having a lot of pain with this hernia and was wondering if he can be referred or treated for his hernia. Patient is requesting a call back at [phone]. Please advise.

## 2023-08-17 NOTE — Telephone Encounter (Signed)
 Pt stated that he would like to schedule a follow up appointment to discuss his umbilical hernia. Pt was scheduled to see Deanna May NP on 09/12/2023 at 9:00 AM. Pt made aware. Pt verbalized understanding with all questions answered.

## 2023-08-21 ENCOUNTER — Other Ambulatory Visit: Payer: Self-pay | Admitting: Nurse Practitioner

## 2023-08-21 DIAGNOSIS — G4700 Insomnia, unspecified: Secondary | ICD-10-CM

## 2023-08-21 DIAGNOSIS — R110 Nausea: Secondary | ICD-10-CM

## 2023-08-23 NOTE — Telephone Encounter (Signed)
 Please advise [facility]

## 2023-08-29 ENCOUNTER — Encounter: Payer: Self-pay | Admitting: Nurse Practitioner

## 2023-08-29 ENCOUNTER — Ambulatory Visit (INDEPENDENT_AMBULATORY_CARE_PROVIDER_SITE_OTHER): Payer: Self-pay | Admitting: Nurse Practitioner

## 2023-08-29 VITALS — BP 116/75 | HR 47 | Temp 97.1°F | Wt 207.0 lb

## 2023-08-29 DIAGNOSIS — R062 Wheezing: Secondary | ICD-10-CM | POA: Insufficient documentation

## 2023-08-29 DIAGNOSIS — I10 Essential (primary) hypertension: Secondary | ICD-10-CM

## 2023-08-29 DIAGNOSIS — R0602 Shortness of breath: Secondary | ICD-10-CM | POA: Diagnosis not present

## 2023-08-29 DIAGNOSIS — G4700 Insomnia, unspecified: Secondary | ICD-10-CM | POA: Diagnosis not present

## 2023-08-29 DIAGNOSIS — F32A Depression, unspecified: Secondary | ICD-10-CM

## 2023-08-29 DIAGNOSIS — Z87891 Personal history of nicotine dependence: Secondary | ICD-10-CM | POA: Insufficient documentation

## 2023-08-29 DIAGNOSIS — K219 Gastro-esophageal reflux disease without esophagitis: Secondary | ICD-10-CM

## 2023-08-29 DIAGNOSIS — F419 Anxiety disorder, unspecified: Secondary | ICD-10-CM

## 2023-08-29 MED ORDER — VALSARTAN-HYDROCHLOROTHIAZIDE 80-12.5 MG PO TABS: 1.0000 | ORAL_TABLET | Freq: Every day | ORAL | 1 refills | Status: DC

## 2023-08-29 MED ORDER — QUETIAPINE FUMARATE 25 MG PO TABS
75.0000 mg | ORAL_TABLET | Freq: Every day | ORAL | 3 refills | Status: DC
Start: 2023-08-29 — End: 2024-01-23

## 2023-08-29 MED ORDER — SERTRALINE HCL 50 MG PO TABS
50.0000 mg | ORAL_TABLET | Freq: Every day | ORAL | 1 refills | Status: DC
Start: 2023-08-29 — End: 2023-10-24

## 2023-08-29 MED ORDER — ALBUTEROL SULFATE HFA 108 (90 BASE) MCG/ACT IN AERS
2.0000 | INHALATION_SPRAY | Freq: Four times a day (QID) | RESPIRATORY_TRACT | 1 refills | Status: AC | PRN
Start: 2023-08-29 — End: ?

## 2023-08-29 NOTE — Assessment & Plan Note (Signed)
 BP Readings from Last 3 Encounters:  08/29/23 116/75  08/02/23 107/81  05/26/23 130/70   HTN Controlled .  On valsartan -hydrochlorothiazide  80-125 mg daily Continue current medications. No changes in management.

## 2023-08-29 NOTE — Assessment & Plan Note (Signed)
 Start albuterol inhaler 2 puffs every 6 hours as needed - Ambulatory referral to Pulmonology

## 2023-08-29 NOTE — Assessment & Plan Note (Signed)
 Flowsheet Row Office Visit from 08/29/2023 in [facility] - A Dept Of [facility]  PHQ-9 Total Score 3          08/29/2023    9:21 AM 12/28/2022   12:30 PM  GAD 7 : Generalized Anxiety Score  Nervous, Anxious, on Edge 0 1  Control/stop worrying 0 1  Worry too much - different things 1 0  Trouble relaxing 1 3  Restless 0 3  Easily annoyed or irritable 2 0  Afraid - awful might happen 0 0  Total GAD 7 Score 4 8  Anxiety Difficulty Not difficult at all Not difficult at all  Continue Zoloft 50 mg daily, hydroxyzine  25 mg 3 times daily as needed Starting Seroquel  75 mg for insomnia He missed his appointment with psychiatrist due to his work schedule

## 2023-08-29 NOTE — Patient Instructions (Signed)

## 2023-08-29 NOTE — Assessment & Plan Note (Signed)
 Continue pantoprazole 40 mg daily.  ?

## 2023-08-29 NOTE — Assessment & Plan Note (Addendum)
    08/29/2023    9:20 AM 12/28/2022   12:29 PM 12/28/2022   12:28 PM  Depression screen PHQ 2/9  Decreased Interest 0 0 0  Down, Depressed, Hopeless 0 0 0  PHQ - 2 Score 0 0 0  Altered sleeping 3    Tired, decreased energy 0    Change in appetite 0    Feeling bad or failure about yourself  0    Trouble concentrating 0    Moving slowly or fidgety/restless 0    Suicidal thoughts 0    PHQ-9 Score 3    Difficult doing work/chores Not difficult at all

## 2023-08-29 NOTE — Assessment & Plan Note (Signed)
 Currently on Seroquel  50 mg daily, takes the medication in the afternoon because if he takes it at night it takes him longer time to sleep, gets about 7 hours of sleep nightly, has trouble falling asleep.  Home sleep study was ordered but he never received the equipment Increase Seroquel  to 75 mg nightly, home sleep study ordered

## 2023-08-29 NOTE — Progress Notes (Signed)
 Established Patient Office Visit  Subjective:  Patient ID: Danny Villa, male    DOB: [birth year]  Age: 48 y.o. MRN: [id number]  CC:  Chief Complaint  Patient presents with   Hypertension    HPI Danny Villa is a 48 y.o. male  has a past medical history of Anxiety, Depression, GERD (gastroesophageal reflux disease), Hypertension, and Suicide attempt (HCC) (03/2014).  Patient presents for follow-up for his chronic medical conditions  Hypertension.  Currently on valsartan -hydrochlorothiazide  80-12.5 mg 1 tablet daily.  Currently denies chest pain, shortness of breath, edema   GERD .takes pantoprazole  40 mg daily .up-to-date with colonoscopy test was negative for dysplasia and carcinoma, fundic type gastric mucosa showing minimal chronic gastritis with reactive epithelial changes, reactive duodenal mucosa showing gastric metaplasia compatible with peptic duodenitis.  He is only symptom is nausea.  He denies abdominal pain    Past Medical History:  Diagnosis Date   Anxiety    Depression    GERD (gastroesophageal reflux disease)    Hypertension    Suicide attempt (HCC) 03/2014   ingested pills    No past surgical history on file.  Family History  Problem Relation Age of Onset   Mental illness Mother    Colon cancer Neg Hx    Colon polyps Neg Hx    Esophageal cancer Neg Hx    Rectal cancer Neg Hx    Stomach cancer Neg Hx     Social History   Socioeconomic History   Marital status: Married    Spouse name: Not on file   Number of children: 10   Years of education: Not on file   Highest education level: Not on file  Occupational History   Not on file  Tobacco Use   Smoking status: Former    Current packs/day: 0.00    Types: Cigarettes    Quit date: 05/2022    Years since quitting: 1.2   Smokeless tobacco: Never  Vaping Use   Vaping status: Every Day   Substances: Flavoring  Substance and Sexual Activity   Alcohol use: Yes    Comment: occasionally-holidays    Drug use: Yes    Types: Marijuana    Comment: none today 08/02/23   Sexual activity: Yes  Other Topics Concern   Not on file  Social History Narrative   Lives with his room mates .    Social Drivers of Corporate investment banker Strain: Not on file  Food Insecurity: Not on file  Transportation Needs: Not on file  Physical Activity: Not on file  Stress: Not on file  Social Connections: Not on file  Intimate Partner Violence: Not on file    Outpatient Medications Prior to Visit  Medication Sig Dispense Refill   hydrOXYzine  (VISTARIL ) 25 MG capsule TAKE 1 CAPSULE(25 MG) BY MOUTH AT BEDTIME AS NEEDED 30 capsule 2   ondansetron  (ZOFRAN ) 4 MG tablet TAKE 1 TABLET(4 MG) BY MOUTH EVERY 8 HOURS AS NEEDED FOR NAUSEA OR VOMITING 20 tablet 0   pantoprazole  (PROTONIX ) 40 MG tablet Take 1 tablet (40 mg total) by mouth daily before breakfast. 30 tablet 3   QUEtiapine  (SEROQUEL ) 25 MG tablet TAKE 1 TABLET(25 MG) BY MOUTH AT BEDTIME 30 tablet 1   sertraline (ZOLOFT) 50 MG tablet TAKE 1 TABLET BY MOUTH DAILY 30 tablet 1   valsartan -hydrochlorothiazide  (DIOVAN -HCT) 80-12.5 MG tablet TAKE 1 TABLET BY MOUTH DAILY 90 tablet 1   hydrocortisone  (ANUSOL -HC) 2.5 % rectal cream Place 1 Application rectally  2 (two) times daily as needed for hemorrhoids or anal itching. (Patient not taking: Reported on 08/29/2023) 30 g 1   ibuprofen  (ADVIL ) 200 MG tablet Take 200-400 mg by mouth every 6 (six) hours as needed for headache or mild pain. (Patient not taking: Reported on 08/29/2023)     meloxicam  (MOBIC ) 15 MG tablet Take 1 tablet (15 mg total) by mouth daily. (Patient not taking: Reported on 08/29/2023) 30 tablet 0   No facility-administered medications prior to visit.    Allergies  Allergen Reactions   Shellfish Allergy Anaphylaxis, Itching and Swelling    ROS Review of Systems  Constitutional:  Negative for appetite change, chills, fatigue and fever.  HENT:  Negative for congestion, postnasal drip,  rhinorrhea and sneezing.   Respiratory:  Negative for cough, shortness of breath and wheezing.   Cardiovascular:  Negative for chest pain, palpitations and leg swelling.  Gastrointestinal:  Positive for nausea. Negative for abdominal pain, constipation and vomiting.  Genitourinary:  Negative for difficulty urinating, dysuria, flank pain and frequency.  Musculoskeletal:  Negative for arthralgias, back pain, joint swelling and myalgias.  Skin:  Negative for color change, pallor, rash and wound.  Neurological:  Negative for dizziness, facial asymmetry, weakness, numbness and headaches.  Psychiatric/Behavioral:  Negative for behavioral problems, confusion, self-injury and suicidal ideas.       Objective:     Physical Exam Vitals and nursing note reviewed.  Constitutional:      General: He is not in acute distress.    Appearance: Normal appearance. He is not ill-appearing, toxic-appearing or diaphoretic.  Eyes:     General: No scleral icterus.       Right eye: No discharge.        Left eye: No discharge.     Extraocular Movements: Extraocular movements intact.     Conjunctiva/sclera: Conjunctivae normal.  Cardiovascular:     Rate and Rhythm: Normal rate and regular rhythm.     Pulses: Normal pulses.     Heart sounds: Normal heart sounds. No murmur heard.    No friction rub. No gallop.  Pulmonary:     Effort: Pulmonary effort is normal. No respiratory distress.     Breath sounds: Normal breath sounds. No stridor. No wheezing, rhonchi or rales.  Chest:     Chest wall: No tenderness.  Abdominal:     General: There is no distension.     Palpations: Abdomen is soft.     Tenderness: There is no abdominal tenderness. There is no right CVA tenderness, left CVA tenderness or guarding.  Musculoskeletal:        General: No swelling, tenderness, deformity or signs of injury.     Right lower leg: No edema.     Left lower leg: No edema.  Skin:    General: Skin is warm and dry.      Capillary Refill: Capillary refill takes less than 2 seconds.     Coloration: Skin is not jaundiced or pale.     Findings: No bruising, erythema or lesion.  Neurological:     Mental Status: He is alert and oriented to person, place, and time.     Motor: No weakness.     Gait: Gait normal.  Psychiatric:        Mood and Affect: Mood normal.        Behavior: Behavior normal.        Thought Content: Thought content normal.        Judgment: Judgment normal.  BP 116/75   Pulse (!) 47   Temp (!) 97.1 F (36.2 C)   Wt 207 lb (93.9 kg)   SpO2 99%   BMI 27.31 kg/m  Wt Readings from Last 3 Encounters:  08/29/23 207 lb (93.9 kg)  08/02/23 204 lb (92.5 kg)  05/26/23 204 lb (92.5 kg)    No results found for: "TSH" Lab Results  Component Value Date   WBC 5.9 12/28/2022   HGB 14.2 12/28/2022   HCT 43.7 12/28/2022   MCV 88 12/28/2022   PLT 278 12/28/2022   Lab Results  Component Value Date   NA 136 12/28/2022   K 4.6 12/28/2022   CO2 23 12/28/2022   GLUCOSE 97 12/28/2022   BUN 22 12/28/2022   CREATININE 0.94 12/28/2022   BILITOT 0.3 12/28/2022   ALKPHOS 93 12/28/2022   AST 20 12/28/2022   ALT 21 12/28/2022   PROT 7.9 12/28/2022   ALBUMIN 4.8 12/28/2022   CALCIUM 10.0 12/28/2022   ANIONGAP 12 07/02/2021   EGFR 99 12/28/2022   No results found for: "CHOL" No results found for: "HDL" No results found for: "LDLCALC" No results found for: "TRIG" No results found for: "CHOLHDL" Lab Results  Component Value Date   HGBA1C 6.3 (H) 12/28/2022      Assessment & Plan:   Problem List Items Addressed This Visit       Cardiovascular and Mediastinum   Primary hypertension - Primary   BP Readings from Last 3 Encounters:  08/29/23 116/75  08/02/23 107/81  05/26/23 130/70   HTN Controlled .  On valsartan -hydrochlorothiazide  80-125 mg daily Continue current medications. No changes in management.          Relevant Medications   valsartan -hydrochlorothiazide   (DIOVAN -HCT) 80-12.5 MG tablet   Other Relevant Orders   CMP14+EGFR     Digestive   Gastroesophageal reflux disease   Continue pantoprazole  40 mg daily        Other   Insomnia   Currently on Seroquel  50 mg daily, takes the medication in the afternoon because if he takes it at night it takes him longer time to sleep, gets about 7 hours of sleep nightly, has trouble falling asleep.  Home sleep study was ordered but he never received the equipment Increase Seroquel  to 75 mg nightly, home sleep study ordered       Relevant Medications   QUEtiapine  (SEROQUEL ) 25 MG tablet   Other Relevant Orders   Home sleep test   Anxiety and depression   Flowsheet Row Office Visit from 08/29/2023 in [facility] Dept Of [facility]  PHQ-9 Total Score 3          08/29/2023    9:21 AM 12/28/2022   12:30 PM  GAD 7 : Generalized Anxiety Score  Nervous, Anxious, on Edge 0 1  Control/stop worrying 0 1  Worry too much - different things 1 0  Trouble relaxing 1 3  Restless 0 3  Easily annoyed or irritable 2 0  Afraid - awful might happen 0 0  Total GAD 7 Score 4 8  Anxiety Difficulty Not difficult at all Not difficult at all  Continue Zoloft 50 mg daily, hydroxyzine  25 mg 3 times daily as needed Starting Seroquel  75 mg for insomnia He missed his appointment with psychiatrist due to his work schedule        Relevant Medications   sertraline (ZOLOFT) 50 MG tablet   Shortness of breath  Relevant Medications   albuterol (VENTOLIN HFA) 108 (90 Base) MCG/ACT inhaler   Other Relevant Orders   Ambulatory referral to Pulmonology   Wheezing   Start albuterol inhaler 2 puffs every 6 hours as needed - Ambulatory referral to Pulmonology       Relevant Medications   albuterol (VENTOLIN HFA) 108 (90 Base) MCG/ACT inhaler   Former cigarette smoker   Started smoking at age 4, quit smoking at age 50 Smoked 1 pack of cigarettes daily He reports intermittent  wheezing, shortness of breath Albuterol inhaler 2 puffs every 6 hours as needed ordered Patient referred to pulmonologist for further evaluation      Relevant Orders   Ambulatory referral to Pulmonology    Meds ordered this encounter  Medications   QUEtiapine  (SEROQUEL ) 25 MG tablet    Sig: Take 3 tablets (75 mg total) by mouth at bedtime.    Dispense:  90 tablet    Refill:  3   valsartan -hydrochlorothiazide  (DIOVAN -HCT) 80-12.5 MG tablet    Sig: Take 1 tablet by mouth daily.    Dispense:  90 tablet    Refill:  1   albuterol (VENTOLIN HFA) 108 (90 Base) MCG/ACT inhaler    Sig: Inhale 2 puffs into the lungs every 6 (six) hours as needed for wheezing or shortness of breath.    Dispense:  8 g    Refill:  1   sertraline (ZOLOFT) 50 MG tablet    Sig: Take 1 tablet (50 mg total) by mouth daily.    Dispense:  90 tablet    Refill:  1    Follow-up: Return in about 4 months (around 12/29/2023) for CPE.    Kanya Potteiger R Melonee Gerstel, FNP

## 2023-08-29 NOTE — Assessment & Plan Note (Signed)
 Started smoking at age 47, quit smoking at age 62 Smoked 1 pack of cigarettes daily He reports intermittent wheezing, shortness of breath Albuterol inhaler 2 puffs every 6 hours as needed ordered Patient referred to pulmonologist for further evaluation

## 2023-08-30 ENCOUNTER — Ambulatory Visit: Payer: Self-pay | Admitting: Nurse Practitioner

## 2023-08-30 LAB — CMP14+EGFR
ALT: 30 IU/L (ref 0–44)
AST: 22 IU/L (ref 0–40)
Albumin: 4.7 g/dL (ref 4.1–5.1)
Alkaline Phosphatase: 95 IU/L (ref 44–121)
BUN/Creatinine Ratio: 16 (ref 9–20)
BUN: 15 mg/dL (ref 6–24)
Bilirubin Total: <0.2 mg/dL (ref 0.0–1.2)
CO2: 25 mmol/L (ref 20–29)
Calcium: 9.4 mg/dL (ref 8.7–10.2)
Chloride: 98 mmol/L (ref 96–106)
Creatinine, Ser: 0.93 mg/dL (ref 0.76–1.27)
Globulin, Total: 2.7 g/dL (ref 1.5–4.5)
Glucose: 109 mg/dL — ABNORMAL HIGH (ref 70–99)
Potassium: 4.5 mmol/L (ref 3.5–5.2)
Sodium: 137 mmol/L (ref 134–144)
Total Protein: 7.4 g/dL (ref 6.0–8.5)
eGFR: 100 mL/min/{1.73_m2} (ref >59)

## 2023-09-11 ENCOUNTER — Other Ambulatory Visit: Payer: Self-pay | Admitting: Nurse Practitioner

## 2023-09-11 DIAGNOSIS — R110 Nausea: Secondary | ICD-10-CM

## 2023-09-12 ENCOUNTER — Ambulatory Visit (INDEPENDENT_AMBULATORY_CARE_PROVIDER_SITE_OTHER): Admitting: Gastroenterology

## 2023-09-12 ENCOUNTER — Ambulatory Visit: Payer: Self-pay | Admitting: Gastroenterology

## 2023-09-12 ENCOUNTER — Encounter: Payer: Self-pay | Admitting: Gastroenterology

## 2023-09-12 ENCOUNTER — Other Ambulatory Visit (INDEPENDENT_AMBULATORY_CARE_PROVIDER_SITE_OTHER)

## 2023-09-12 VITALS — BP 120/80 | HR 56 | Ht 70.75 in | Wt 206.5 lb

## 2023-09-12 DIAGNOSIS — R7303 Prediabetes: Secondary | ICD-10-CM

## 2023-09-12 DIAGNOSIS — R110 Nausea: Secondary | ICD-10-CM

## 2023-09-12 DIAGNOSIS — K219 Gastro-esophageal reflux disease without esophagitis: Secondary | ICD-10-CM | POA: Diagnosis not present

## 2023-09-12 DIAGNOSIS — K429 Umbilical hernia without obstruction or gangrene: Secondary | ICD-10-CM | POA: Diagnosis not present

## 2023-09-12 LAB — HEMOGLOBIN A1C: Hgb A1c MFr Bld: 6.8 % — ABNORMAL HIGH (ref 4.6–6.5)

## 2023-09-12 MED ORDER — ONDANSETRON HCL 4 MG PO TABS: 4.0000 mg | ORAL_TABLET | Freq: Three times a day (TID) | ORAL | 1 refills | Status: DC | PRN

## 2023-09-12 NOTE — Patient Instructions (Addendum)
 Continue Pantoprazole  40 mg po daily Continue GERD diet, no late meals Ondansetron  as needed No NSAIDs. Recommend small meals spread throughout the day, take your am medication with food Continue high fiber diet Continue fiber supplementation  If no improvement in nausea, we can discuss doing gastric emptying test   Your provider has requested that you go to the basement level for lab work before leaving today. Press B on the elevator. The lab is located at the first door on the left as you exit the elevator.   _______________________________________________________  If your blood pressure at your visit was 140/90 or greater, please contact your primary care physician to follow up on this.  _______________________________________________________  If you are age 13 or older, your body mass index should be between 23-30. Your Body mass index is 29 kg/m. If this is out of the aforementioned range listed, please consider follow up with your Primary Care Provider.  If you are age 52 or younger, your body mass index should be between 19-25. Your Body mass index is 29 kg/m. If this is out of the aformentioned range listed, please consider follow up with your Primary Care Provider.   ________________________________________________________  The  GI providers would like to encourage you to use MYCHART to communicate with providers for non-urgent requests or questions.  Due to long hold times on the telephone, sending your provider a message by [facility] may be a faster and more efficient way to get a response.  Please allow 48 business hours for a response.  Please remember that this is for non-urgent requests.  _______________________________________________________   Thank you for trusting me with your gastrointestinal care. Deanna May, RNP

## 2023-09-12 NOTE — Telephone Encounter (Signed)
 Please advise [facility]

## 2023-09-12 NOTE — Progress Notes (Signed)
 Chief Complaint: follow-up nausea, hernia Primary GI Doctor: Dr. Venice Gillis  HPI:  Patient is a 48 year old African American male patient with past medical history of Anxiety, Depression, GERD, Hypertension, who was referred to me by Paseda, Folashade R, FNP on 03/28/23 for a complaint of nausea and hernia. Patient last last seen by myself on 05/26/23. Scheduled EGD/colon.   GI procedures: 08/02/2023 with Dr. Venice Gillis Colonoscopy-- One 6 mm polyp in the rectum, removed with a cold snare. Resected and retrieved. Mild pancolonic diverticulosis. Non- bleeding internal hemorrhoids. The examined portion of the ileum was normal. The examination was otherwise normal on direct and retroflexion views. Recall 10 years Path: HPP EGD-- Moderate gastritis. Biopsied. Mucosal nodule ( likely inflammatory) found in the duodenum. Biopsied. The examination was otherwise normal. Path: Biopsies from upper endoscopy were all negative. Gastric biopsies were negative for bacteria called H. pylori. The esophageal and small bowel biopsies were negative.  Interval History    Patient presents with main complaint of nausea for the past 1.5 years. He will take ondansetron  once daily in am. He continues with daily nausea on empty stomach. No early satiety. He stopped all NSAID use. No abdominal pain. Appetite good. He does tell me he takes all his medication except Seroquel  in the morning on empty stomach.  He will not eat until dinner which is typically a large meal and usually consists of dessert.    Patient has history of GERD and taking Pantoprazole  40 mg po daily which controls his symptoms. Patient denies dysphagia.      Patient had not had any blood in stool since he used the Anusol  topical for internal hemorrhoids and increased his fiber intake.   Wt Readings from Last 3 Encounters:  09/12/23 206 lb 8 oz (93.7 kg)  08/29/23 207 lb (93.9 kg)  08/02/23 204 lb (92.5 kg)    Past Medical History:  Diagnosis Date   Anxiety     Depression    GERD (gastroesophageal reflux disease)    Hypertension    Suicide attempt (HCC) 03/2014   ingested pills   History reviewed. No pertinent surgical history.  Current Outpatient Medications  Medication Sig Dispense Refill   albuterol  (VENTOLIN  HFA) 108 (90 Base) MCG/ACT inhaler Inhale 2 puffs into the lungs every 6 (six) hours as needed for wheezing or shortness of breath. 8 g 1   hydrocortisone  (ANUSOL -HC) 2.5 % rectal cream Place 1 Application rectally 2 (two) times daily as needed for hemorrhoids or anal itching. 30 g 1   hydrOXYzine  (VISTARIL ) 25 MG capsule TAKE 1 CAPSULE(25 MG) BY MOUTH AT BEDTIME AS NEEDED 30 capsule 2   pantoprazole  (PROTONIX ) 40 MG tablet Take 1 tablet (40 mg total) by mouth daily before breakfast. 30 tablet 3   QUEtiapine  (SEROQUEL ) 25 MG tablet Take 3 tablets (75 mg total) by mouth at bedtime. 90 tablet 3   sertraline  (ZOLOFT ) 50 MG tablet Take 1 tablet (50 mg total) by mouth daily. 90 tablet 1   valsartan -hydrochlorothiazide  (DIOVAN -HCT) 80-12.5 MG tablet Take 1 tablet by mouth daily. 90 tablet 1   ondansetron  (ZOFRAN ) 4 MG tablet TAKE 1 TABLET(4 MG) BY MOUTH EVERY 8 HOURS AS NEEDED FOR NAUSEA OR VOMITING 20 tablet 0   No current facility-administered medications for this visit.   Allergies as of 09/12/2023 - Review Complete 09/12/2023  Allergen Reaction Noted   Shellfish allergy Anaphylaxis, Itching, and Swelling 03/03/2019   Family History  Problem Relation Age of Onset   Mental illness Mother  Colon cancer Neg Hx    Colon polyps Neg Hx    Esophageal cancer Neg Hx    Rectal cancer Neg Hx    Stomach cancer Neg Hx    Review of Systems:    Constitutional: No weight loss, fever, chills, weakness or fatigue HEENT: Eyes: No change in vision               Ears, Nose, Throat:  No change in hearing or congestion Skin: No rash or itching Cardiovascular: No chest pain, chest pressure or palpitations   Respiratory: No SOB or  cough Gastrointestinal: See HPI and otherwise negative Genitourinary: No dysuria or change in urinary frequency Neurological: No headache, dizziness or syncope Musculoskeletal: No new muscle or joint pain Hematologic: No bleeding or bruising Psychiatric: No history of depression or anxiety    Physical Exam:  Vital signs: BP 120/80 (BP Location: Left Arm, Patient Position: Sitting, Cuff Size: Normal)   Pulse (!) 56   Ht 5' 10.75 (1.797 m) Comment: height measured without shoes  Wt 206 lb 8 oz (93.7 kg)   BMI 29.00 kg/m   Constitutional:   Pleasant African American male appears to be in NAD, Well developed, Well nourished, alert and cooperative Throat: Oral cavity and pharynx without inflammation, swelling or lesion.  Respiratory: Respirations even and unlabored. Lungs clear to auscultation bilaterally.   No wheezes, crackles, or rhonchi.  Cardiovascular: Normal S1, S2. Regular rate and rhythm. No peripheral edema, cyanosis or pallor.  Gastrointestinal:  Soft, nondistended, nontender. No rebound or guarding. Normal bowel sounds. No appreciable masses or hepatomegaly. Umbilical hernia noted. Rectal:  Not performed.  Msk:  Symmetrical without gross deformities. Without edema, no deformity or joint abnormality.  Neurologic:  Alert and  oriented x4;  grossly normal neurologically.  Skin:   Dry and intact without significant lesions or rashes. Psychiatric: Oriented to person, place and time. Demonstrates good judgement and reason without abnormal affect or behaviors.  RELEVANT LABS AND IMAGING: CBC    Latest Ref Rng & Units 12/28/2022   12:31 PM 07/02/2021    1:43 AM 04/17/2016   12:18 AM  CBC  WBC 3.4 - 10.8 x10E3/uL 5.9  7.7  10.4   Hemoglobin 13.0 - 17.7 g/dL 13.0  86.5  78.4   Hematocrit 37.5 - 51.0 % 43.7  43.2  46.0   Platelets 150 - 450 x10E3/uL 278  275  301     CMP     Latest Ref Rng & Units 08/29/2023    9:51 AM 12/28/2022   12:31 PM 07/02/2021    1:43 AM  CMP  Glucose 70  - 99 mg/dL 696  97  85   BUN 6 - 24 mg/dL 15  22  15    Creatinine 0.76 - 1.27 mg/dL 2.95  2.84  1.32   Sodium 134 - 144 mmol/L 137  136  138   Potassium 3.5 - 5.2 mmol/L 4.5  4.6  4.0   Chloride 96 - 106 mmol/L 98  99  106   CO2 20 - 29 mmol/L 25  23  20    Calcium 8.7 - 10.2 mg/dL 9.4  44.0  9.7   Total Protein 6.0 - 8.5 g/dL 7.4  7.9  7.8   Total Bilirubin 0.0 - 1.2 mg/dL <1.0  0.3  0.6   Alkaline Phos 44 - 121 IU/L 95  93  68   AST 0 - 40 IU/L 22  20  18    ALT 0 - 44 IU/L  30  21  30      Assessment: Encounter Diagnoses  Name Primary?   Gastroesophageal reflux disease without esophagitis Yes   Chronic nausea    Umbilical hernia without obstruction and without gangrene       48 year old [place] American male patient with main complaint of nausea. He does have history of NSAID use and recommended he stop the Ibuprofen  which he has done since last visit. 5/25 EGD with biopsy was negative. He will continue PPI therapy once daily. Will have him take the zofran  prn.Last HA1c (10/24) 6.3. Will recheck today. Jedd Schulenburg consider gastric emptying study if no improvement in symptoms. He does not have early satiety or abd pain/bloat. Recommended he take his morning meds with food and have small snacks throughout the day.     The altered stools and intermittent blood in stool resolved with adding fiber supplementation. 5/25 colonoscopy with internal hemorrhoids and 1 HPP.      The umbilical hernia is not causing him any issues and therefore he does not wish to pursue it further.   Plan: -Continue Pantoprazole  40 mg po daily -Continue GERD diet, no late meals - recheck Ha1c today -Ondansetron  as needed, refill -No NSAIDs. -Recommend he have small meals spread throughout the day -Continue high fiber diet -Continue fiber supplementation -Consider gastric emptying scan if no improvement  Thank you for the courtesy of this consult. Please call me with any questions or concerns.   Joylynn Defrancesco,  FNP-C [facility] 09/12/2023, 9:33 AM  Cc: Paseda, Folashade R, FNP

## 2023-10-24 ENCOUNTER — Other Ambulatory Visit: Payer: Self-pay | Admitting: Nurse Practitioner

## 2023-10-24 ENCOUNTER — Other Ambulatory Visit: Payer: Self-pay | Admitting: Gastroenterology

## 2023-10-24 DIAGNOSIS — R110 Nausea: Secondary | ICD-10-CM

## 2023-10-24 DIAGNOSIS — F32A Depression, unspecified: Secondary | ICD-10-CM

## 2023-10-24 NOTE — Telephone Encounter (Signed)
 Please advise [facility]

## 2023-10-25 ENCOUNTER — Other Ambulatory Visit: Payer: Self-pay

## 2023-11-02 ENCOUNTER — Other Ambulatory Visit: Payer: Self-pay | Admitting: Gastroenterology

## 2023-11-02 DIAGNOSIS — R110 Nausea: Secondary | ICD-10-CM

## 2023-11-15 ENCOUNTER — Ambulatory Visit (HOSPITAL_BASED_OUTPATIENT_CLINIC_OR_DEPARTMENT_OTHER): Attending: Nurse Practitioner | Admitting: Internal Medicine

## 2023-11-15 ENCOUNTER — Encounter (HOSPITAL_BASED_OUTPATIENT_CLINIC_OR_DEPARTMENT_OTHER): Payer: Self-pay

## 2023-12-09 ENCOUNTER — Ambulatory Visit

## 2023-12-28 ENCOUNTER — Ambulatory Visit

## 2023-12-29 ENCOUNTER — Ambulatory Visit

## 2023-12-30 ENCOUNTER — Ambulatory Visit: Payer: Self-pay | Admitting: Nurse Practitioner

## 2024-01-01 ENCOUNTER — Other Ambulatory Visit: Payer: Self-pay | Admitting: Gastroenterology

## 2024-01-01 DIAGNOSIS — R110 Nausea: Secondary | ICD-10-CM

## 2024-01-09 ENCOUNTER — Other Ambulatory Visit: Payer: Self-pay | Admitting: Nurse Practitioner

## 2024-01-09 DIAGNOSIS — F32A Depression, unspecified: Secondary | ICD-10-CM

## 2024-01-23 ENCOUNTER — Other Ambulatory Visit: Payer: Self-pay | Admitting: Nurse Practitioner

## 2024-01-23 DIAGNOSIS — G4700 Insomnia, unspecified: Secondary | ICD-10-CM

## 2024-01-23 NOTE — Telephone Encounter (Signed)
 Please advise [facility]

## 2024-02-15 ENCOUNTER — Ambulatory Visit: Admitting: Pulmonary Disease

## 2024-02-15 ENCOUNTER — Ambulatory Visit (INDEPENDENT_AMBULATORY_CARE_PROVIDER_SITE_OTHER)

## 2024-02-15 ENCOUNTER — Encounter: Payer: Self-pay | Admitting: Pulmonary Disease

## 2024-02-15 VITALS — BP 149/96 | HR 65 | Temp 98.2°F | Ht 71.0 in | Wt 211.0 lb

## 2024-02-15 DIAGNOSIS — R002 Palpitations: Secondary | ICD-10-CM

## 2024-02-15 NOTE — Progress Notes (Deleted)
 Cardiology Clinic Note   Patient Name: Danny Villa Date of Encounter: 02/15/2024  Primary Care Provider:  Paseda, Folashade R, FNP Primary Cardiologist:  None  Patient Profile    Danny Villa 48 year old male presents to the clinic today for evaluation of his palpitations.  Past Medical History    Past Medical History:  Diagnosis Date   Anxiety    Depression    GERD (gastroesophageal reflux disease)    Hypertension    Suicide attempt (HCC) 03/2014   ingested pills   No past surgical history on file.  Allergies  Allergies  Allergen Reactions   Shellfish Allergy Anaphylaxis, Itching and Swelling    History of Present Illness    Danny Villa has a PMH of GERD, hypertension, anxiety, depression, and palpitations.  He was seen and evaluated by Dr. Annella, [facility] pulmonology.  During that time he denied shortness of breath.  He denied dyspnea on exertion.  He reported fluttering in his chest.  He noted that this would happen a couple times per week.  It would last for a few seconds.  He noted this was substernal.  The sensation has been happening for a few months.  His EKG was reviewed which showed no abnormalities.  He did note an occasional cough.  He reported that he had stopped smoking cigarettes since 2013.  Chest x-ray showed no obvious anatomic abnormalities.  He was referred to cardiology for further evaluation.  He presents to the clinic today and states***.  *** denies chest pain, shortness of breath, lower extremity edema, fatigue, palpitations, melena, hematuria, hemoptysis, diaphoresis, weakness, presyncope, syncope, orthopnea, and PND.  Palpitations-EKG today shows***.  Notes episodes have been happening for a couple months.  Reports palpitations occur for a few seconds and then dissipate without intervention.  Denies exacerbating factors. Avoid triggers caffeine, chocolate, EtOH, dehydration excetra. Maintain p.o. hydration Order 14-day  cardiac event monitor Order CBC, BMP, magnesium  Essential hypertension-BP today***. Maintain blood pressure log Heart healthy low-sodium diet Continue valsartan , hydrochlorothiazide   Disposition: Follow-up with Dr. Kate or me in around 8 weeks.  Home Medications    Prior to Admission medications   Medication Sig Start Date End Date Taking? Authorizing Provider  albuterol  (VENTOLIN  HFA) 108 (90 Base) MCG/ACT inhaler Inhale 2 puffs into the lungs every 6 (six) hours as needed for wheezing or shortness of breath. 08/29/23   Paseda, Folashade R, FNP  hydrOXYzine  (VISTARIL ) 25 MG capsule TAKE 1 CAPSULE(25 MG) BY MOUTH AT BEDTIME AS NEEDED 05/16/23   Paseda, Folashade R, FNP  ondansetron  (ZOFRAN ) 4 MG tablet TAKE 1 TABLET(4 MG) BY MOUTH EVERY 8 HOURS AS NEEDED FOR NAUSEA OR VOMITING 01/02/24   May, Deanna J, NP  pantoprazole  (PROTONIX ) 40 MG tablet Take 1 tablet (40 mg total) by mouth daily before breakfast. 05/26/23   Charlanne Groom, MD  QUEtiapine  (SEROQUEL ) 25 MG tablet TAKE 3 TABLETS(75 MG) BY MOUTH AT BEDTIME 01/23/24   Paseda, Folashade R, FNP  sertraline  (ZOLOFT ) 50 MG tablet TAKE 1 TABLET BY MOUTH DAILY 01/09/24   Nichols, Tonya S, NP  valsartan -hydrochlorothiazide  (DIOVAN -HCT) 80-12.5 MG tablet Take 1 tablet by mouth daily. 08/29/23   Paseda, Folashade R, FNP    Family History    Family History  Problem Relation Age of Onset   Mental illness Mother    Colon cancer Neg Hx    Colon polyps Neg Hx    Esophageal cancer Neg Hx    Rectal cancer Neg Hx  Stomach cancer Neg Hx    He indicated that his mother is alive. He indicated that the status of his neg hx is unknown.  Social History    Social History   Socioeconomic History   Marital status: Married    Spouse name: Not on file   Number of children: 10   Years of education: Not on file   Highest education level: Not on file  Occupational History   Not on file  Tobacco Use   Smoking status: Former    Current packs/day:  0.00    Types: Cigarettes    Quit date: 05/2022    Years since quitting: 1.7   Smokeless tobacco: Never  Vaping Use   Vaping status: Every Day   Substances: Flavoring  Substance and Sexual Activity   Alcohol use: Yes    Comment: occasionally-holidays   Drug use: Yes    Types: Marijuana    Comment: none today 08/02/23   Sexual activity: Yes  Other Topics Concern   Not on file  Social History Narrative   Lives with his room mates .    Social Drivers of Corporate Investment Banker Strain: Not on file  Food Insecurity: Not on file  Transportation Needs: Not on file  Physical Activity: Not on file  Stress: Not on file  Social Connections: Not on file  Intimate Partner Violence: Not on file     Review of Systems    General:  No chills, fever, night sweats or weight changes.  Cardiovascular:  No chest pain, dyspnea on exertion, edema, orthopnea, palpitations, paroxysmal nocturnal dyspnea. Dermatological: No rash, lesions/masses Respiratory: No cough, dyspnea Urologic: No hematuria, dysuria Abdominal:   No nausea, vomiting, diarrhea, bright red blood per rectum, melena, or hematemesis Neurologic:  No visual changes, wkns, changes in mental status. All other systems reviewed and are otherwise negative except as noted above.  Physical Exam    VS:  There were no vitals taken for this visit. , BMI There is no height or weight on file to calculate BMI. GEN: Well nourished, well developed, in no acute distress. HEENT: normal. Neck: Supple, no JVD, carotid bruits, or masses. Cardiac: RRR, no murmurs, rubs, or gallops. No clubbing, cyanosis, edema.  Radials/DP/PT 2+ and equal bilaterally.  Respiratory:  Respirations regular and unlabored, clear to auscultation bilaterally. GI: Soft, nontender, nondistended, BS + x 4. MS: no deformity or atrophy. Skin: warm and dry, no rash. Neuro:  Strength and sensation are intact. Psych: Normal affect.  Accessory Clinical Findings    Recent  Labs: 08/29/2023: ALT 30; BUN 15; Creatinine, Ser 0.93; Potassium 4.5; Sodium 137   Recent Lipid Panel No results found for: CHOL, TRIG, HDL, CHOLHDL, VLDL, LDLCALC, LDLDIRECT       ECG personally reviewed by me today- ***     EKG 07/02/2021  Sinus rhythm ST and T wave deviation 62 bpm   Assessment & Plan   1.  ***   Josefa HERO. Abdulahi Schor NP-C     02/15/2024, 3:23 PM [facility] [address] [place], [place] [address] Office [phone]    Notice: This dictation was prepared with Dragon dictation along with smaller phrase technology. Any transcriptional errors that result from this process are unintentional and may not be corrected upon review.   I spent***minutes examining this patient, reviewing medications, and using patient centered shared decision making involving their cardiac care.   I spent  20 minutes reviewing past medical history,  medications, and prior cardiac tests.

## 2024-02-15 NOTE — Patient Instructions (Addendum)
 Nice to meet you  Since you are not short of breath I will not do any further pulmonary testing  With the fluttering I worry about heart palpitations.  I have sent a referral to the heart doctor for further evaluation.  I will get a chest x-ray today just to make sure there is nothing in the lungs that could be contributing.  Return to clinic as needed

## 2024-02-15 NOTE — Progress Notes (Signed)
 @Patient  ID: Danny Villa, male    DOB: 1976-03-09, 48 y.o.   MRN: [id number]  Chief Complaint  Patient presents with   Consult   Shortness of Breath    Referred by pcp. Patient states he's having fluttering of the chest ( denies sob).  Started 8 months ago.    Referring provider: Paseda, Folashade R, FNP  HPI:   48 y.o. whom we are seeing for dyspnea on exertion.  Note from referring provider reviewed.  There is no discussion in  referring provider's note regarding symptoms etc. in the subjective.  The objective just lists referral to pulmonary and an albuterol  inhaler.  No discussion of possible etiologies or workup etc.  No imaging done.  Most recent chest imaging I can view 09/2021 on my review interpretation reveals hyperinflation on the lateral with increased lucency between the sternum and anterior heart border.  He denies any shortness of breath.  Denies any dyspnea on exertion.  His chief complaint is fluttering in the chest.  Happens a couple times a week on average.  Last a few seconds.  He points to the bilateral sternum with a sensation moving up to mid sternum.  Has been present now for months.  Review EKG in 2023 and 2014 revealed no PVCs or abnormality, sinus rhythm or sinus bradycardia on report.  He has occasional cough.  On review of symptoms.  He does not complain about this today.  Usually after vaping.  He vapes post THC and flavored cartridges.  No nicotine.  This helped him quit smoking.  He has been abstinent from cigarettes since 2023 per his report.  Questionaires / Pulmonary Flowsheets:   ACT:      No data to display          MMRC:     No data to display          Epworth:      No data to display          Tests:   FENO:  No results found for: NITRICOXIDE  PFT:     No data to display          WALK:      No data to display          Imaging: Personally viewed and as per EMR and discussion in this note No results  found.  Lab Results: Personally reviewed CBC    Component Value Date/Time   WBC 5.9 12/28/2022 1231   WBC 7.7 07/02/2021 0143   RBC 4.95 12/28/2022 1231   RBC 4.73 07/02/2021 0143   HGB 14.2 12/28/2022 1231   HCT 43.7 12/28/2022 1231   PLT 278 12/28/2022 1231   MCV 88 12/28/2022 1231   MCH 28.7 12/28/2022 1231   MCH 29.2 07/02/2021 0143   MCHC 32.5 12/28/2022 1231   MCHC 31.9 07/02/2021 0143   RDW 13.3 12/28/2022 1231   LYMPHSABS 2.4 07/02/2021 0143   MONOABS 0.5 07/02/2021 0143   EOSABS 0.5 07/02/2021 0143   BASOSABS 0.1 07/02/2021 0143    BMET    Component Value Date/Time   NA 137 08/29/2023 0951   K 4.5 08/29/2023 0951   CL 98 08/29/2023 0951   CO2 25 08/29/2023 0951   GLUCOSE 109 (H) 08/29/2023 0951   GLUCOSE 85 07/02/2021 0143   BUN 15 08/29/2023 0951   CREATININE 0.93 08/29/2023 0951   CALCIUM 9.4 08/29/2023 0951   GFRNONAA >60 07/02/2021 0143   GFRAA >60 04/17/2016 0018  BNP No results found for: BNP  ProBNP No results found for: PROBNP  Specialty Problems       Pulmonary Problems   Shortness of breath   Wheezing    Allergies  Allergen Reactions   Shellfish Allergy Anaphylaxis, Itching and Swelling    Immunization History  Administered Date(s) Administered   Tdap 07/08/2011, 03/03/2019    Past Medical History:  Diagnosis Date   Anxiety    Depression    GERD (gastroesophageal reflux disease)    Hypertension    Suicide attempt (HCC) 03/2014   ingested pills    Tobacco History: Social History   Tobacco Use  Smoking Status Former   Current packs/day: 0.00   Types: Cigarettes   Quit date: 05/2022   Years since quitting: 1.7  Smokeless Tobacco Never   Counseling given: Not Answered   Continue to not smoke  Outpatient Encounter Medications as of 02/15/2024  Medication Sig   albuterol  (VENTOLIN  HFA) 108 (90 Base) MCG/ACT inhaler Inhale 2 puffs into the lungs every 6 (six) hours as needed for wheezing or shortness of  breath.   hydrOXYzine  (VISTARIL ) 25 MG capsule TAKE 1 CAPSULE(25 MG) BY MOUTH AT BEDTIME AS NEEDED   ondansetron  (ZOFRAN ) 4 MG tablet TAKE 1 TABLET(4 MG) BY MOUTH EVERY 8 HOURS AS NEEDED FOR NAUSEA OR VOMITING   pantoprazole  (PROTONIX ) 40 MG tablet Take 1 tablet (40 mg total) by mouth daily before breakfast.   QUEtiapine  (SEROQUEL ) 25 MG tablet TAKE 3 TABLETS(75 MG) BY MOUTH AT BEDTIME   sertraline  (ZOLOFT ) 50 MG tablet TAKE 1 TABLET BY MOUTH DAILY   valsartan -hydrochlorothiazide  (DIOVAN -HCT) 80-12.5 MG tablet Take 1 tablet by mouth daily.   hydrocortisone  (ANUSOL -HC) 2.5 % rectal cream Place 1 Application rectally 2 (two) times daily as needed for hemorrhoids or anal itching. (Patient not taking: Reported on 02/15/2024)   No facility-administered encounter medications on file as of 02/15/2024.     Review of Systems  Review of Systems  No chest pain with exertion.  No orthopnea or PND.  Comprehensive review of systems otherwise negative. Physical Exam  BP (!) 149/96   Pulse 65   Temp 98.2 F (36.8 C) (Oral)   Ht 5' 11 (1.803 m)   Wt 211 lb (95.7 kg)   SpO2 93%   BMI 29.43 kg/m   Wt Readings from Last 5 Encounters:  02/15/24 211 lb (95.7 kg)  09/12/23 206 lb 8 oz (93.7 kg)  08/29/23 207 lb (93.9 kg)  08/02/23 204 lb (92.5 kg)  05/26/23 204 lb (92.5 kg)    BMI Readings from Last 5 Encounters:  02/15/24 29.43 kg/m  09/12/23 29.00 kg/m  08/29/23 27.31 kg/m  08/02/23 26.91 kg/m  05/26/23 26.91 kg/m     Physical Exam General: Sitting in chair, no distress Eyes: EOMI, icterus Neck: Supple, JVP Pulmonary: Clear, normal work of breathing Cardiovascular: Warm, no edema Abdomen: Nondistended  Assessment & Plan:   Fluttering in the chest: Sounds like palpitations.  Present for a few seconds at a time.  Present for months.  On average twice a week.  He denies syncope or presyncope with the symptoms.  Prior EKGs, only 2, without disease or other significant arrhythmia.   Referral to cardiology for further evaluation.  Chest x-ray today to make sure no obvious anatomic abnormality contributing.  Tobacco abuse in remission: Congratulated on complete abstinence from cigarette smoking.  Continue to encourage complete abstinence from tobacco products.  Hyperinflation on chest x-ray: Related to smoking versus underlying asthma.  No significant symptoms per his report.  Has not used albuterol  due to no symptoms of shortness of breath.  No further workup at this time.   Return if symptoms worsen or fail to improve.   Donnice JONELLE Beals, MD 02/15/2024

## 2024-02-16 ENCOUNTER — Ambulatory Visit: Admitting: General Practice

## 2024-02-17 ENCOUNTER — Telehealth: Payer: Self-pay

## 2024-02-17 NOTE — Telephone Encounter (Signed)
 sertraline  (ZOLOFT ) 50 MG tablet [[id number]]

## 2024-02-21 ENCOUNTER — Other Ambulatory Visit: Payer: Self-pay

## 2024-02-21 ENCOUNTER — Ambulatory Visit: Attending: Physician Assistant | Admitting: Physician Assistant

## 2024-02-21 DIAGNOSIS — F419 Anxiety disorder, unspecified: Secondary | ICD-10-CM

## 2024-02-21 MED ORDER — SERTRALINE HCL 50 MG PO TABS: 50.0000 mg | ORAL_TABLET | Freq: Every day | ORAL | 2 refills | Status: AC

## 2024-02-21 NOTE — Telephone Encounter (Signed)
 Please advise [facility]

## 2024-02-21 NOTE — Telephone Encounter (Signed)
 Sent to pcp

## 2024-03-01 ENCOUNTER — Ambulatory Visit: Payer: Self-pay

## 2024-03-01 NOTE — Telephone Encounter (Signed)
 FYI Only or Action Required?: FYI only for provider: appointment scheduled on 03/02/24.  Patient was last seen in primary care on 08/29/2023 by Paseda, Folashade R, FNP.  Called Nurse Triage reporting Abdominal Pain.  Symptoms began several months ago.  Interventions attempted: Prescription medications: Zofran .  Symptoms are: gradually worsening.  Triage Disposition: See Physician Within 24 Hours (overriding See HCP Within 4 Hours (Or PCP Triage))  Patient/caregiver understands and will follow disposition?: Yes         Copied from CRM #[id number]. Topic: Clinical - Red Word Triage >> Mar 01, 2024  3:10 PM Wess RAMAN wrote: Red Word that prompted transfer to Nurse Triage: Patient's girlfriend, Rossis, stated patient is having pain and swelling in his stomach. He was told he had a hernia by [facility] Reason for Disposition  [1] MILD-MODERATE pain AND [2] constant AND [3] present > 2 hours  Answer Assessment - Initial Assessment Questions 1. LOCATION: Where does it hurt?      Umbilical, just above and below belly button.  2. RADIATION: Does the pain shoot anywhere else? (e.g., chest, back)     No.  3. ONSET: When did the pain begin? (Minutes, hours or days ago)      X Few/several months, worsening.  4. SUDDEN: Gradual or sudden onset?     Gradual  5. PATTERN Does the pain come and go, or is it constant?     Comes and goes  6. SEVERITY: How bad is the pain?  (e.g., Scale 1-10; mild, moderate, or severe)     Patient is currently sleeping 0/10 at this time but she states when he is awake and experiencing pain 8/10.  7. RECURRENT SYMPTOM: Have you ever had this type of stomach pain before? If Yes, ask: When was the last time? and What happened that time?      Yes. She states he was seen by [facility] and told they didn't need to worry about it but she states it has not resolved and is gradually worsening.  8. CAUSE: What do you think  is causing the stomach pain? (e.g., gallstones, recent abdominal surgery)     Hernia.  9. RELIEVING/AGGRAVATING FACTORS: What makes it better or worse? (e.g., antacids, bending or twisting motion, bowel movement)     Worse when drinking cold water.  10. OTHER SYMPTOMS: Do you have any other symptoms? (e.g., back pain, diarrhea, fever, urination pain, vomiting)       All over abdominal swelling (denies lump or protruding hernia at this time). Denies fever, vomiting, diarrhea, nausea, constipation, bloody or black tarry stool, testicular or scrotal pain.  Protocols used: Abdominal Pain - Male-A-AH

## 2024-03-02 ENCOUNTER — Ambulatory Visit: Payer: Self-pay | Admitting: Nurse Practitioner

## 2024-03-02 NOTE — Progress Notes (Deleted)
 Cardiology Clinic Note   Patient Name: Danny Villa Date of Encounter: 03/02/2024  Primary Care Provider:  Paseda, Folashade R, FNP Primary Cardiologist:  None  Patient Profile    Danny Villa 48 year old male presents to the clinic today for evaluation of his palpitations.  Past Medical History    Past Medical History:  Diagnosis Date   Anxiety    Depression    GERD (gastroesophageal reflux disease)    Hypertension    Suicide attempt (HCC) 03/2014   ingested pills   No past surgical history on file.  Allergies  Allergies  Allergen Reactions   Shellfish Allergy Anaphylaxis, Itching and Swelling    History of Present Illness    Danny Villa has a PMH of GERD, hypertension, anxiety, depression, and palpitations.  He was seen and evaluated by Dr. Annella, [facility] pulmonology.  During that time he denied shortness of breath.  He denied dyspnea on exertion.  He reported fluttering in his chest.  He noted that this would happen a couple times per week.  It would last for a few seconds.  He noted this was substernal.  The sensation has been happening for a few months.  His EKG was reviewed which showed no abnormalities.  He did note an occasional cough.  He reported that he had stopped smoking cigarettes since 2013.  Chest x-ray showed no obvious anatomic abnormalities.  He was referred to cardiology for further evaluation.  He presents to the clinic today and states***.    Home Medications    Prior to Admission medications   Medication Sig Start Date End Date Taking? Authorizing Provider  albuterol  (VENTOLIN  HFA) 108 (90 Base) MCG/ACT inhaler Inhale 2 puffs into the lungs every 6 (six) hours as needed for wheezing or shortness of breath. 08/29/23   Paseda, Folashade R, FNP  hydrOXYzine  (VISTARIL ) 25 MG capsule TAKE 1 CAPSULE(25 MG) BY MOUTH AT BEDTIME AS NEEDED 05/16/23   Paseda, Folashade R, FNP  ondansetron  (ZOFRAN ) 4 MG tablet TAKE 1 TABLET(4 MG) BY MOUTH  EVERY 8 HOURS AS NEEDED FOR NAUSEA OR VOMITING 01/02/24   May, Deanna J, NP  pantoprazole  (PROTONIX ) 40 MG tablet Take 1 tablet (40 mg total) by mouth daily before breakfast. 05/26/23   Charlanne Groom, MD  QUEtiapine  (SEROQUEL ) 25 MG tablet TAKE 3 TABLETS(75 MG) BY MOUTH AT BEDTIME 01/23/24   Paseda, Folashade R, FNP  sertraline  (ZOLOFT ) 50 MG tablet TAKE 1 TABLET BY MOUTH DAILY 01/09/24   Nichols, Tonya S, NP  valsartan -hydrochlorothiazide  (DIOVAN -HCT) 80-12.5 MG tablet Take 1 tablet by mouth daily. 08/29/23   Paseda, Folashade R, FNP    Family History    Family History  Problem Relation Age of Onset   Mental illness Mother    Colon cancer Neg Hx    Colon polyps Neg Hx    Esophageal cancer Neg Hx    Rectal cancer Neg Hx    Stomach cancer Neg Hx    He indicated that his mother is alive. He indicated that the status of his neg hx is unknown.  Social History    Social History   Socioeconomic History   Marital status: Married    Spouse name: Not on file   Number of children: 10   Years of education: Not on file   Highest education level: Not on file  Occupational History   Not on file  Tobacco Use   Smoking status: Former    Current packs/day: 0.00  Types: Cigarettes    Quit date: 05/2022    Years since quitting: 1.7   Smokeless tobacco: Never  Vaping Use   Vaping status: Every Day   Substances: Flavoring  Substance and Sexual Activity   Alcohol use: Yes    Comment: occasionally-holidays   Drug use: Yes    Types: Marijuana    Comment: none today 08/02/23   Sexual activity: Yes  Other Topics Concern   Not on file  Social History Narrative   Lives with his room mates .    Social Drivers of Corporate Investment Banker Strain: Not on file  Food Insecurity: Not on file  Transportation Needs: Not on file  Physical Activity: Not on file  Stress: Not on file  Social Connections: Not on file  Intimate Partner Violence: Not on file     Review of Systems    General:   No chills, fever, night sweats or weight changes.  Cardiovascular:  No chest pain, dyspnea on exertion, edema, orthopnea, palpitations, paroxysmal nocturnal dyspnea. Dermatological: No rash, lesions/masses Respiratory: No cough, dyspnea Urologic: No hematuria, dysuria Abdominal:   No nausea, vomiting, diarrhea, bright red blood per rectum, melena, or hematemesis Neurologic:  No visual changes, wkns, changes in mental status. All other systems reviewed and are otherwise negative except as noted above.  Physical Exam    VS:  There were no vitals taken for this visit. , BMI There is no height or weight on file to calculate BMI. GEN: Well nourished, well developed, in no acute distress. HEENT: normal. Neck: Supple, no JVD, carotid bruits, or masses. Cardiac: RRR, no murmurs, rubs, or gallops. No clubbing, cyanosis, edema.  Radials/DP/PT 2+ and equal bilaterally.  Respiratory:  Respirations regular and unlabored, clear to auscultation bilaterally. GI: Soft, nontender, nondistended, BS + x 4. MS: no deformity or atrophy. Skin: warm and dry, no rash. Neuro:  Strength and sensation are intact. Psych: Normal affect.  Accessory Clinical Findings    Recent Labs: 08/29/2023: ALT 30; BUN 15; Creatinine, Ser 0.93; Potassium 4.5; Sodium 137   Recent Lipid Panel No results found for: CHOL, TRIG, HDL, CHOLHDL, VLDL, LDLCALC, LDLDIRECT  No BP recorded.  {Refresh Note OR Click here to enter BP  :1}***    ECG personally reviewed by me today- ***     EKG 07/02/2021  Sinus rhythm ST and T wave deviation 62 bpm   Assessment & Plan   1.  ***   Rollo FABIENE Louder, PA-C 03/02/2024 12:44 PM

## 2024-03-04 ENCOUNTER — Other Ambulatory Visit: Payer: Self-pay | Admitting: Gastroenterology

## 2024-03-04 DIAGNOSIS — R110 Nausea: Secondary | ICD-10-CM

## 2024-03-06 ENCOUNTER — Ambulatory Visit: Admitting: Cardiology

## 2024-03-09 ENCOUNTER — Ambulatory Visit: Attending: Physician Assistant | Admitting: Physician Assistant

## 2024-03-09 ENCOUNTER — Ambulatory Visit

## 2024-03-09 ENCOUNTER — Ambulatory Visit: Payer: Self-pay | Admitting: Nurse Practitioner

## 2024-03-09 VITALS — BP 120/60 | HR 57 | Ht 71.0 in | Wt 211.8 lb

## 2024-03-09 DIAGNOSIS — R002 Palpitations: Secondary | ICD-10-CM | POA: Diagnosis not present

## 2024-03-09 DIAGNOSIS — I10 Essential (primary) hypertension: Secondary | ICD-10-CM | POA: Diagnosis not present

## 2024-03-09 DIAGNOSIS — E119 Type 2 diabetes mellitus without complications: Secondary | ICD-10-CM | POA: Diagnosis not present

## 2024-03-09 LAB — CBC
Hematocrit: 42.7 % (ref 37.5–51.0)
Hemoglobin: 13.7 g/dL (ref 13.0–17.7)
MCH: 28.2 pg (ref 26.6–33.0)
MCHC: 32.1 g/dL (ref 31.5–35.7)
MCV: 88 fL (ref 79–97)
Platelets: 300 x10E3/uL (ref 150–450)
RBC: 4.86 x10E6/uL (ref 4.14–5.80)
RDW: 13.8 % (ref 11.6–15.4)
WBC: 7.4 x10E3/uL (ref 3.4–10.8)

## 2024-03-09 LAB — TSH: TSH: 2.32 u[IU]/mL (ref 0.450–4.500)

## 2024-03-09 NOTE — Progress Notes (Unsigned)
 Enrolled patient for a 14 day Zio XT monitor to be mailed to patients home  [place] to read

## 2024-03-09 NOTE — Progress Notes (Signed)
 Cardiology Office Note   Date:  03/09/2024  ID:  QUANTAVIOUS EGGERT, DOB 08-Feb-1976, MRN [id number] PCP: Paseda, Folashade R, FNP  [facility] Providers Cardiologist:  [facility] - Dr. Ren Ny  History of Present Illness Danny Villa is a 48 y.o. male with past medical history of anxiety/depression, GERD, hypertension and DM II.  Patient was recently seen by his pulmonologist Dr. Annella on 02/15/2024 at which time he complained of occasional palpitation.  He denies any dizziness associated with the symptom.  Patient presents today for heart first clinic evaluation of palpitation.  Symptom has been going on for about a year.  He says it typically occur about twice a week and may last up to 20 minutes.  He described the symptom as a fluttering sensation.  He has no problem achieving at least 4 metabolic level of activity.  He denies any chest pain or shortness of breath.  He has no known cardiac issue.  He does not know his family history as he was not raised by them.  He does vape and does marijuana.  He is a former smoker.  He has never had major surgery.  I recommended CBC and TSH to rule out secondary causes for palpitation.  Basic metabolic panel obtained by PCP in June was normal.  I also recommended echocardiogram and a 2-week ZIO XT heart monitor.  If both echo and a ZIO XT heart monitor came back okay, I would not recommend any further workup and the patient can follow-up with cardiology service as needed.  I will tentatively schedule him a 16-month follow-up, however if both the study come back okay, he is okay to cancel the follow-up.   ROS:   He denies chest pain, dyspnea, pnd, orthopnea, n, v, dizziness, syncope, edema, weight gain, or early satiety. All other systems reviewed and are otherwise negative except as noted above.   Patient complains of occasional palpitation that lasts up to 20 minutes.  Studies Reviewed          Risk Assessment/Calculations            Physical Exam VS:  BP 120/60   Pulse (!) 57   Ht 5' 11 (1.803 m)   Wt 211 lb 12.8 oz (96.1 kg)   SpO2 95%   BMI 29.54 kg/m        Wt Readings from Last 3 Encounters:  03/09/24 211 lb 12.8 oz (96.1 kg)  02/15/24 211 lb (95.7 kg)  09/12/23 206 lb 8 oz (93.7 kg)    GEN: Well nourished, well developed in no acute distress NECK: No JVD; No carotid bruits CARDIAC: RRR, no murmurs, rubs, gallops RESPIRATORY:  Clear to auscultation without rales, wheezing or rhonchi  ABDOMEN: Soft, non-tender, non-distended EXTREMITIES:  No edema; No deformity   ASSESSMENT AND PLAN  Palpitation: Patient has been experiencing palpitation for the past year.  The symptom occurs about twice a week and may last more than 20 minutes.  Will obtain echocardiogram and heart monitor.  Will also check a CBC and TSH to rule out secondary causes behind the palpitation.  If echocardiogram and the heart monitor came back okay, no further workup is recommended.   Hypertension: Blood pressure well-controlled.  DM2: Recent blood work in June 2025 showed hemoglobin of 6.8 which placed the patient in the diabetes range.  This is new for him.  He is scheduled to see his PCP today.        Dispo: Follow-up with cardiology  service in 2 months, however if both echocardiogram and heart monitor came back normal, he can follow-up as needed.  Signed, Scot Ford, PA

## 2024-03-09 NOTE — Patient Instructions (Signed)
 Medication Instructions:  Your physician recommends that you continue on your current medications as directed. Please refer to the Current Medication list given to you today.  *If you need a refill on your cardiac medications before your next appointment, please call your pharmacy*  Lab Work: TODAY:  CBC & TSH  If you have labs (blood work) drawn today and your tests are completely normal, you will receive your results only by: MyChart Message (if you have MyChart) OR A paper copy in the mail If you have any lab test that is abnormal or we need to change your treatment, we will call you to review the results.  Testing/Procedures: Your physician has requested that you have an echocardiogram. Echocardiography is a painless test that uses sound waves to create images of your heart. It provides your doctor with information about the size and shape of your heart and how well your hearts chambers and valves are working. This procedure takes approximately one hour. There are no restrictions for this procedure. Please do NOT wear cologne, perfume, aftershave, or lotions (deodorant is allowed). Please arrive 15 minutes prior to your appointment time.  Please note: We ask at that you not bring children with you during ultrasound (echo/ vascular) testing. Due to room size and safety concerns, children are not allowed in the ultrasound rooms during exams. Our front office staff cannot provide observation of children in our lobby area while testing is being conducted. An adult accompanying a patient to their appointment will only be allowed in the ultrasound room at the discretion of the ultrasound technician under special circumstances. We apologize for any inconvenience.    ZIO XT- Long Term Monitor Instructions  Your physician has requested you wear a ZIO patch monitor for 14 days.  This is a single patch monitor. [employer] supplies one patch monitor per enrollment. Additional stickers are not  available. Please do not apply patch if you will be having a Nuclear Stress Test,  Echocardiogram, Cardiac CT, MRI, or Chest Xray during the period you would be wearing the  monitor. The patch cannot be worn during these tests. You cannot remove and re-apply the  ZIO XT patch monitor.  Your ZIO patch monitor will be mailed 3 day USPS to your address on file. It may take 3-5 days  to receive your monitor after you have been enrolled.  Once you have received your monitor, please review the enclosed instructions. Your monitor  has already been registered assigning a specific monitor serial # to you.  Billing and Patient Assistance Program Information  We have supplied [employer] with any of your insurance information on file for billing purposes. [employer] offers a sliding scale Patient Assistance Program for patients that do not have  insurance, or whose insurance does not completely cover the cost of the ZIO monitor.  You must apply for the Patient Assistance Program to qualify for this discounted rate.  To apply, please call [employer] at [phone], select option 4, select option 2, ask to apply for  Patient Assistance Program. Meredeth will ask your household income, and how many people  are in your household. They will quote your out-of-pocket cost based on that information.  [employer] will also be able to set up a 31-month, interest-free payment plan if needed.  Applying the monitor   Shave hair from upper left chest.  Hold abrader disc by orange tab. Rub abrader in 40 strokes over the upper left chest as  indicated in your monitor instructions.  Clean area  with 4 enclosed alcohol pads. Let dry.  Apply patch as indicated in monitor instructions. Patch will be placed under collarbone on left  side of chest with arrow pointing upward.  Rub patch adhesive wings for 2 minutes. Remove white label marked 1. Remove the white  label marked 2. Rub patch adhesive wings for 2 additional minutes.   While looking in a mirror, press and release button in center of patch. A small green light will  flash 3-4 times. This will be your only indicator that the monitor has been turned on.  Do not shower for the first 24 hours. You may shower after the first 24 hours.  Press the button if you feel a symptom. You will hear a small click. Record Date, Time and  Symptom in the Patient Logbook.  When you are ready to remove the patch, follow instructions on the last 2 pages of Patient  Logbook. Stick patch monitor onto the last page of Patient Logbook.  Place Patient Logbook in the blue and white box. Use locking tab on box and tape box closed  securely. The blue and white box has prepaid postage on it. Please place it in the mailbox as  soon as possible. Your physician should have your test results approximately 7 days after the  monitor has been mailed back to [facility]  Call [facility] Customer Care at [phone] if you have questions regarding  your ZIO XT patch monitor. Call them immediately if you see an orange light blinking on your  monitor.  If your monitor falls off in less than 4 days, contact our Monitor department at [phone].  If your monitor becomes loose or falls off after 4 days call [employer] at [phone] for  suggestions on securing your monitor   Follow-Up: At [facility], you and your health needs are our priority.  As part of our continuing mission to provide you with exceptional heart care, our providers are all part of one team.  This team includes your primary Cardiologist (physician) and Advanced Practice Providers or APPs (Physician Assistants and Nurse Practitioners) who all work together to provide you with the care you need, when you need it.  Your next appointment:   2 month(s)  Provider:   Hao Meng, PA-C          We recommend signing up for the patient portal called MyChart.  Sign up information is provided on this After Visit  Summary.  MyChart is used to connect with patients for Virtual Visits (Telemedicine).  Patients are able to view lab/test results, encounter notes, upcoming appointments, etc.  Non-urgent messages can be sent to your provider as well.   To learn more about what you can do with MyChart, go to [email or web address].   Other Instructions

## 2024-03-12 ENCOUNTER — Ambulatory Visit: Payer: Self-pay | Admitting: Physician Assistant

## 2024-03-26 ENCOUNTER — Other Ambulatory Visit: Payer: Self-pay | Admitting: Gastroenterology

## 2024-03-26 DIAGNOSIS — K219 Gastro-esophageal reflux disease without esophagitis: Secondary | ICD-10-CM

## 2024-03-27 ENCOUNTER — Ambulatory Visit: Admitting: Nurse Practitioner

## 2024-03-29 ENCOUNTER — Encounter (HOSPITAL_COMMUNITY): Payer: Self-pay | Admitting: *Deleted

## 2024-03-29 ENCOUNTER — Emergency Department (HOSPITAL_COMMUNITY)

## 2024-03-29 ENCOUNTER — Other Ambulatory Visit: Payer: Self-pay

## 2024-03-29 ENCOUNTER — Emergency Department (HOSPITAL_COMMUNITY)
Admission: EM | Admit: 2024-03-29 | Discharge: 2024-03-29 | Disposition: A | Discharge status: Home / Self Care | Attending: Emergency Medicine | Admitting: Emergency Medicine

## 2024-03-29 DIAGNOSIS — I10 Essential (primary) hypertension: Secondary | ICD-10-CM | POA: Diagnosis not present

## 2024-03-29 DIAGNOSIS — R002 Palpitations: Secondary | ICD-10-CM | POA: Diagnosis present

## 2024-03-29 DIAGNOSIS — I4891 Unspecified atrial fibrillation: Secondary | ICD-10-CM | POA: Insufficient documentation

## 2024-03-29 DIAGNOSIS — Z7982 Long term (current) use of aspirin: Secondary | ICD-10-CM | POA: Insufficient documentation

## 2024-03-29 LAB — CBC
HCT: 46.8 % (ref 39.0–52.0)
Hemoglobin: 15.2 g/dL (ref 13.0–17.0)
MCH: 28.5 pg (ref 26.0–34.0)
MCHC: 32.5 g/dL (ref 30.0–36.0)
MCV: 87.6 fL (ref 80.0–100.0)
Platelets: 278 K/uL (ref 150–400)
RBC: 5.34 MIL/uL (ref 4.22–5.81)
RDW: 14.5 % (ref 11.5–15.5)
WBC: 6.1 K/uL (ref 4.0–10.5)
nRBC: 0 % (ref 0.0–0.2)

## 2024-03-29 LAB — BASIC METABOLIC PANEL WITH GFR
Anion gap: 13 (ref 5–15)
BUN: 14 mg/dL (ref 6–20)
CO2: 22 mmol/L (ref 22–32)
Calcium: 9.7 mg/dL (ref 8.9–10.3)
Chloride: 102 mmol/L (ref 98–111)
Creatinine, Ser: 0.79 mg/dL (ref 0.61–1.24)
GFR, Estimated: >60 mL/min
Glucose, Bld: 111 mg/dL — ABNORMAL HIGH (ref 70–99)
Potassium: 4.2 mmol/L (ref 3.5–5.1)
Sodium: 137 mmol/L (ref 135–145)

## 2024-03-29 LAB — MAGNESIUM: Magnesium: 2.1 mg/dL (ref 1.7–2.4)

## 2024-03-29 MED ORDER — ASPIRIN 325 MG PO TBEC: 325.0000 mg | DELAYED_RELEASE_TABLET | Freq: Every day | ORAL | 1 refills | Status: AC

## 2024-03-29 NOTE — ED Triage Notes (Signed)
 PT here via [facility] from home for rapid heart rate with palpitations.   Initial hr 140's to 180 afib RVR.  Pt self converted to nsr 64 enroute (with an initial brady to 43).  Pt denies palpitations presently.  Given no meds.  Pt has been having palpitations for several months and seen by cardiologist with no dx of afib with cardiac monitoring.  Bp 136/93 Spot 95% ra Hr 62 nsr

## 2024-03-29 NOTE — Discharge Instructions (Addendum)
 Start taking an aspirin daily.  Follow-up with the cardiologist office to be rechecked as we discussed.  Return to the ER for recurrent episodes of rapid heart rate

## 2024-03-29 NOTE — ED Notes (Signed)
 To x-ray

## 2024-03-29 NOTE — ED Provider Triage Note (Addendum)
 Emergency Medicine Provider Triage Evaluation Note  DORAN NESTLE , a 49 y.o. male  was evaluated in triage.  Pt complains of palpitations.    He states he was feeling his heart race ongoing from last night until this morning.  He called EMS.  They noted he was in A-fib RVR.  Patient converted spontaneously back into sinus rhythm during his transport.  Patient reportedly has seen cardiology in the past.  He did have outpatient cardiac monitoring that did not show any acute abnormality.  Review of Systems  Positive: Palpitation Negative: Chest pain  Physical Exam  BP (!) 128/94   Pulse 64   Temp 97.9 F (36.6 C) (Oral)   Resp 16   SpO2 96%  Gen:   Awake, no distress   Resp:  Normal effort  MSK:   Moves extremities without difficulty  Other:     Medical Decision Making  Medically screening exam initiated at 12:44 PM.  Appropriate orders placed.  Dasie JONELLE Lento was informed that the remainder of the evaluation will be completed by another provider, this initial triage assessment does not replace that evaluation, and the importance of remaining in the ED until their evaluation is complete.     Randol Simmonds, MD 03/29/24 1246    Randol Simmonds, MD 03/29/24 1249

## 2024-03-29 NOTE — ED Provider Notes (Signed)
 " [facility] AT [facility] Provider Note   CSN: [id number] Arrival date & time: 03/29/24  1229     Patient presents with: Palpitations and Tachycardia   Danny Villa is a 49 y.o. male.    Palpitations    Patient has a history of hypertension anxiety depression acid reflux.  Patient also has a history of palpitations.  He has previously been seen by cardiology.  He had outpatient cardiac monitoring that did not show any clear etiology.  Patient states he started having trouble with palpitations last evening.  It persisted until this morning.  EMS arrived and found him to be in atrial fibrillation with rapid rate.  Patient's symptoms all resolved on route.  He has not had any recurrent episodes.  He is not have any chest pain.  No fevers  Prior to Admission medications  Medication Sig Start Date End Date Taking? Authorizing Provider  aspirin EC 325 MG tablet Take 1 tablet (325 mg total) by mouth daily. 03/29/24 05/28/24 Yes Randol Simmonds, MD  albuterol  (VENTOLIN  HFA) 108 (90 Base) MCG/ACT inhaler Inhale 2 puffs into the lungs every 6 (six) hours as needed for wheezing or shortness of breath. 08/29/23   Paseda, Folashade R, FNP  hydrOXYzine  (VISTARIL ) 25 MG capsule TAKE 1 CAPSULE(25 MG) BY MOUTH AT BEDTIME AS NEEDED 05/16/23   Paseda, Folashade R, FNP  ondansetron  (ZOFRAN ) 4 MG tablet TAKE 1 TABLET(4 MG) BY MOUTH EVERY 8 HOURS AS NEEDED FOR NAUSEA OR VOMITING 03/05/24   May, Deanna J, NP  pantoprazole  (PROTONIX ) 40 MG tablet TAKE 1 TABLET(40 MG) BY MOUTH DAILY BEFORE BREAKFAST 03/26/24   Charlanne Groom, MD  QUEtiapine  (SEROQUEL ) 25 MG tablet TAKE 3 TABLETS(75 MG) BY MOUTH AT BEDTIME 01/23/24   Paseda, Folashade R, FNP  sertraline  (ZOLOFT ) 50 MG tablet Take 1 tablet (50 mg total) by mouth daily. 02/21/24   Paseda, Folashade R, FNP  valsartan -hydrochlorothiazide  (DIOVAN -HCT) 80-12.5 MG tablet Take 1 tablet by mouth daily. 08/29/23   Paseda, Folashade R, FNP    Allergies:  Shellfish allergy    Review of Systems  Cardiovascular:  Positive for palpitations.    Updated Vital Signs BP (!) 128/94   Pulse 64   Temp 97.9 F (36.6 C) (Oral)   Resp 16   Ht 1.803 m (5' 11)   Wt 96.1 kg   SpO2 96%   BMI 29.54 kg/m   Physical Exam Vitals and nursing note reviewed.  Constitutional:      General: He is not in acute distress.    Appearance: He is well-developed.  HENT:     Head: Normocephalic and atraumatic.     Right Ear: External ear normal.     Left Ear: External ear normal.  Eyes:     General: No scleral icterus.       Right eye: No discharge.        Left eye: No discharge.     Conjunctiva/sclera: Conjunctivae normal.  Neck:     Trachea: No tracheal deviation.  Cardiovascular:     Rate and Rhythm: Normal rate and regular rhythm.  Pulmonary:     Effort: Pulmonary effort is normal. No respiratory distress.     Breath sounds: Normal breath sounds. No stridor. No wheezing or rales.  Abdominal:     General: Bowel sounds are normal. There is no distension.     Palpations: Abdomen is soft.     Tenderness: There is no abdominal tenderness. There is no guarding  or rebound.  Musculoskeletal:        General: No tenderness or deformity.     Cervical back: Neck supple.  Skin:    General: Skin is warm and dry.     Findings: No rash.  Neurological:     General: No focal deficit present.     Mental Status: He is alert.     Cranial Nerves: No cranial nerve deficit, dysarthria or facial asymmetry.     Sensory: No sensory deficit.     Motor: No abnormal muscle tone or seizure activity.     Coordination: Coordination normal.  Psychiatric:        Mood and Affect: Mood normal.     (all labs ordered are listed, but only abnormal results are displayed) Labs Reviewed  BASIC METABOLIC PANEL WITH GFR - Abnormal; Notable for the following components:      Result Value   Glucose, Bld 111 (*)    All other components within normal limits  CBC  MAGNESIUM     EKG: EKG Interpretation Date/Time:  Thursday March 29 2024 12:39:15 EST Ventricular Rate:  59 PR Interval:  168 QRS Duration:  74 QT Interval:  412 QTC Calculation: 407 R Axis:   124  Text Interpretation: Sinus bradycardia Right axis deviation Anteroseptal infarct , age undetermined Abnormal ECG When compared with ECG of 09-Mar-2024 08:27, No significant change since last tracing Confirmed by Randol Simmonds [phone]) on 03/29/2024 12:46:38 PM  Radiology: [facility] Chest 2 View Result Date: 03/29/2024 EXAM: 2 VIEW(S) XRAY OF THE CHEST 03/29/2024 01:05:00 PM COMPARISON: 02/15/2024 CLINICAL HISTORY: palpitations Tachycardia FINDINGS: LUNGS AND PLEURA: No focal pulmonary opacity. No pleural effusion. No pneumothorax. HEART AND MEDIASTINUM: No acute abnormality of the cardiac and mediastinal silhouettes. BONES AND SOFT TISSUES: No acute osseous abnormality. IMPRESSION: 1. No acute cardiopulmonary process. Electronically signed by: Morgane Naveau MD 03/29/2024 01:43 PM EST RP Workstation: [id number]     Procedures   Medications Ordered in the ED - No data to display  Clinical Course as of 03/29/24 1507  Thu Mar 29, 2024  1450 Magnesium nl [JK]  1450 CBC nl [JK]  1450 Basic metabolic panel(!) nl [JK]    Clinical Course User Index [JK] Randol Simmonds, MD         CHA2DS2-VASc Score: 1                        Medical Decision Making Problems Addressed: Atrial fibrillation with RVR [facility]): acute illness or injury that poses a threat to life or bodily functions  Amount and/or Complexity of Data Reviewed Labs: ordered. Decision-making details documented in ED Course. Radiology: ordered and independent interpretation performed.  Risk OTC drugs.   Patient presented to the ED for evaluation of an episode of palpitations.  EMS captured an episode of atrial fibrillation with rapid rate.  Symptoms all resolved spontaneously prior to his arrival in the ED.  Patient was monitored in the ED for several  hours.  He has had no recurrence of his tachycardia.  No signs of any electrolyte abnormalities.  Patient does have CHA2DS2-VASc of 1.  Will start him on aspirin daily for now.  Will hold off on any rate controlling meds as his heart rate is in the 50s and low 60s at this time.  Patient previously saw cardiology but his atrial fibrillation was not identified.  Patient appears stable for discharge outpatient follow-up.     Final diagnoses:  Atrial fibrillation with RVR (HCC)  ED Discharge Orders          Ordered    Ambulatory referral to Cardiology       Comments: If you have not heard from the Cardiology office within the next 72 hours please call [phone].   03/29/24 1504    aspirin EC 325 MG tablet  Daily        03/29/24 1504               Randol Simmonds, MD 03/29/24 1507  "

## 2024-04-03 DIAGNOSIS — R002 Palpitations: Secondary | ICD-10-CM | POA: Diagnosis not present

## 2024-04-03 NOTE — Progress Notes (Signed)
 Reassuring heart monitor, no sign of irregular rhythm. However I am aware that Danny Villa went to the ED on 03/29/2024. Even though EKG in the ED showed sinus rhythm, EMS run strip recorded likely afib. Will discuss afib management on follow up

## 2024-04-04 ENCOUNTER — Inpatient Hospital Stay: Payer: Self-pay | Admitting: Nurse Practitioner

## 2024-04-09 ENCOUNTER — Inpatient Hospital Stay: Admitting: Nurse Practitioner

## 2024-04-11 ENCOUNTER — Inpatient Hospital Stay: Payer: Self-pay | Admitting: Nurse Practitioner

## 2024-04-13 ENCOUNTER — Encounter: Payer: Self-pay | Admitting: Nurse Practitioner

## 2024-04-13 ENCOUNTER — Ambulatory Visit (INDEPENDENT_AMBULATORY_CARE_PROVIDER_SITE_OTHER): Admitting: Nurse Practitioner

## 2024-04-13 VITALS — BP 138/88 | HR 60 | Ht 71.0 in | Wt 206.0 lb

## 2024-04-13 DIAGNOSIS — K219 Gastro-esophageal reflux disease without esophagitis: Secondary | ICD-10-CM | POA: Diagnosis not present

## 2024-04-13 DIAGNOSIS — Z13220 Encounter for screening for lipoid disorders: Secondary | ICD-10-CM | POA: Diagnosis not present

## 2024-04-13 DIAGNOSIS — F325 Major depressive disorder, single episode, in full remission: Secondary | ICD-10-CM

## 2024-04-13 DIAGNOSIS — Z122 Encounter for screening for malignant neoplasm of respiratory organs: Secondary | ICD-10-CM | POA: Diagnosis not present

## 2024-04-13 DIAGNOSIS — I10 Essential (primary) hypertension: Secondary | ICD-10-CM

## 2024-04-13 DIAGNOSIS — Z23 Encounter for immunization: Secondary | ICD-10-CM

## 2024-04-13 DIAGNOSIS — G4700 Insomnia, unspecified: Secondary | ICD-10-CM

## 2024-04-13 DIAGNOSIS — E1165 Type 2 diabetes mellitus with hyperglycemia: Secondary | ICD-10-CM | POA: Insufficient documentation

## 2024-04-13 DIAGNOSIS — Z1321 Encounter for screening for nutritional disorder: Secondary | ICD-10-CM

## 2024-04-13 DIAGNOSIS — K439 Ventral hernia without obstruction or gangrene: Secondary | ICD-10-CM

## 2024-04-13 DIAGNOSIS — I4891 Unspecified atrial fibrillation: Secondary | ICD-10-CM

## 2024-04-13 DIAGNOSIS — F419 Anxiety disorder, unspecified: Secondary | ICD-10-CM | POA: Diagnosis not present

## 2024-04-13 DIAGNOSIS — Z09 Encounter for follow-up examination after completed treatment for conditions other than malignant neoplasm: Secondary | ICD-10-CM

## 2024-04-13 DIAGNOSIS — R252 Cramp and spasm: Secondary | ICD-10-CM

## 2024-04-13 LAB — POCT GLYCOSYLATED HEMOGLOBIN (HGB A1C): Hemoglobin A1C: 6.2 % — AB (ref 4.0–5.6)

## 2024-04-13 MED ORDER — VALSARTAN-HYDROCHLOROTHIAZIDE 80-12.5 MG PO TABS: 1.0000 | ORAL_TABLET | Freq: Every day | ORAL | 1 refills | Status: AC

## 2024-04-13 NOTE — Assessment & Plan Note (Signed)
 Insomnia Currently taking quetiapine  75mg  at bedtime.

## 2024-04-13 NOTE — Assessment & Plan Note (Addendum)
 Gastroesophageal reflux disease Currently taking pantoprazole  40 mg daily. - Continue pantoprazole  40 mg daily.

## 2024-04-13 NOTE — Assessment & Plan Note (Signed)
 Lab Results  Component Value Date   HGBA1C 6.2 (A) 04/13/2024   Type 2 diabetes mellitus A1c improved from 6.8% to 6.2%. - Continue lifestyle modifications including reducing sugar, sweets, soda, bread, and rice intake. - Monitor A1c every 4-6 months. - Ordered lipid panel to screen for hyperlipidemia. - Ordered urine test to check for proteinuria. - Referred to ophthalmology for annual diabetic eye exam. - Performed annual diabetes foot exam. Counseled on low-carb diet

## 2024-04-13 NOTE — Assessment & Plan Note (Signed)
" °  Reports increasing size and soreness. - Referred to surgery for evaluation and potential repair. "

## 2024-04-13 NOTE — Assessment & Plan Note (Signed)
 Atrial fibrillation Recent episode resolved en route to hospital. Experiencing intermittent palpitations and arm spasms. - Provided cardiology office number for follow-up with Dr. Rushie. - Instructed to call cardiology office to schedule an appointment. - Continue aspirin  325 mg daily as per hospital recommendation.

## 2024-04-13 NOTE — Progress Notes (Signed)
 "  Established Patient Office Visit  Subjective:  Patient ID: Danny Villa, male    DOB: 02-12-1976  Age: 49 y.o. MRN: [id number]  CC:  Chief Complaint  Patient presents with   Hospitalization Follow-up    HPI    Discussed the use of AI scribe software for clinical note transcription with the patient, who gave verbal consent to proceed.  History of Present Illness   Danny Villa is a 49 year old male  has a past medical history of Anxiety, Depression, GERD (gastroesophageal reflux disease), Hypertension, and Suicide attempt (HCC) (03/2014). with atrial fibrillation and diabetes who presents with palpitations and follow-up after a recent hospital visit.  He experienced palpitations and tachycardia on March 29, 2024, leading to a hospital visit where he was found to be in atrial fibrillation with a rapid rate. His symptoms resolved en route to the hospital. He was started on aspirin , initially at 81 mg daily, but was later instructed to take 325 mg daily. He has noticed intermittent palpitations since returning home and reports spasms in his arms since the episode.  He has a history of hypertension, managed with valsartan  and hydrochlorothiazide  80-12.5 mg 1 tablet daily, which he takes daily. He did not take his blood pressure medication on the day of the visit due to his work schedule. He also takes Zoloft  50 mg daily for anxiety and pantoprazole  50 mg daily for acid reflux, with hydroxyzine  25 mg 3 times daily as needed anxiety  He was diagnosed with diabetes approximately six months ago with an A1c of 6.8. His most recent A1c is 6.2, and he is not currently on medication for diabetes.  He reports a history of a hernia that has been increasing in size and causing soreness. He experienced abdominal swelling one morning but managed it by resting. He feels the hernia is getting larger and more painful.  He has been experiencing night sweats, waking up 'soaking wet' despite sleeping  with the air conditioning on. No chest pain or shortness of breath.  He quit smoking two years ago after smoking one pack a day for over twenty years started smoking at age 63. He currently vapes and uses marijuana.  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 He experiences muscle spasms in his legs, described as 'throbbing' and 'like an alien coming out of my leg,' which may be related to prolonged standing.     Assessment & Plan       Past Medical History:  Diagnosis Date   Anxiety    Depression    GERD (gastroesophageal reflux disease)    Hypertension    Suicide attempt (HCC) 03/2014   ingested pills    History reviewed. No pertinent surgical history.  Family History  Problem Relation Age of Onset   Mental illness Mother    Colon cancer Neg Hx    Colon polyps Neg Hx    Esophageal cancer Neg Hx    Rectal cancer Neg Hx    Stomach cancer Neg Hx     Social History   Socioeconomic History   Marital status: Married    Spouse name: Not on file   Number of children: 10   Years of education: Not on file   Highest education level: Not on file  Occupational History   Not on file  Tobacco Use   Smoking status: Former    Current packs/day: 0.00  Types: Cigarettes    Quit date: 05/2022    Years since quitting: 1.8   Smokeless tobacco: Never  Vaping Use   Vaping status: Every Day   Substances: Flavoring  Substance and Sexual Activity   Alcohol use: Yes    Comment: occasionally-holidays   Drug use: Yes    Types: Marijuana    Comment: none today 08/02/23   Sexual activity: Yes  Other Topics Concern   Not on file  Social History Narrative   Lives with his room mates .    Social Drivers of Health   Tobacco Use: Medium Risk (04/13/2024)   Patient History    Smoking Tobacco Use: Former    Smokeless Tobacco Use: Never    Passive Exposure: Not on Actuary Strain: Not  on file  Food Insecurity: No Food Insecurity (04/13/2024)   Epic    Worried About Programme Researcher, Broadcasting/film/video in the Last Year: Never true    Ran Out of Food in the Last Year: Never true  Transportation Needs: No Transportation Needs (04/13/2024)   Epic    Lack of Transportation (Medical): No    Lack of Transportation (Non-Medical): No  Physical Activity: Not on file  Stress: Not on file  Social Connections: Not on file  Intimate Partner Violence: Not At Risk (04/13/2024)   Epic    Fear of Current or Ex-Partner: No    Emotionally Abused: No    Physically Abused: No    Sexually Abused: No  Depression (PHQ2-9): Low Risk (04/13/2024)   Depression (PHQ2-9)    PHQ-2 Score: 0  Alcohol Screen: Not on file  Housing: Low Risk (04/13/2024)   Epic    Unable to Pay for Housing in the Last Year: No    Number of Times Moved in the Last Year: 0    Homeless in the Last Year: No  Utilities: Not At Risk (04/13/2024)   Epic    Threatened with loss of utilities: No  Health Literacy: Not on file    Outpatient Medications Prior to Visit  Medication Sig Dispense Refill   albuterol  (VENTOLIN  HFA) 108 (90 Base) MCG/ACT inhaler Inhale 2 puffs into the lungs every 6 (six) hours as needed for wheezing or shortness of breath. 8 g 1   aspirin  EC 325 MG tablet Take 1 tablet (325 mg total) by mouth daily. 30 tablet 1   hydrOXYzine  (VISTARIL ) 25 MG capsule TAKE 1 CAPSULE(25 MG) BY MOUTH AT BEDTIME AS NEEDED 30 capsule 2   ondansetron  (ZOFRAN ) 4 MG tablet TAKE 1 TABLET(4 MG) BY MOUTH EVERY 8 HOURS AS NEEDED FOR NAUSEA OR VOMITING 30 tablet 1   pantoprazole  (PROTONIX ) 40 MG tablet TAKE 1 TABLET(40 MG) BY MOUTH DAILY BEFORE BREAKFAST 30 tablet 1   QUEtiapine  (SEROQUEL ) 25 MG tablet TAKE 3 TABLETS(75 MG) BY MOUTH AT BEDTIME 270 tablet 1   sertraline  (ZOLOFT ) 50 MG tablet Take 1 tablet (50 mg total) by mouth daily. 30 tablet 2   valsartan -hydrochlorothiazide  (DIOVAN -HCT) 80-12.5 MG tablet Take 1 tablet by mouth daily. 90  tablet 1   No facility-administered medications prior to visit.    Allergies[1]  ROS Review of Systems  Constitutional:  Negative for appetite change, chills, fatigue and fever.  HENT:  Negative for congestion, postnasal drip, rhinorrhea and sneezing.   Respiratory:  Negative for cough, shortness of breath and wheezing.   Cardiovascular:  Positive for palpitations. Negative for chest pain and leg swelling.  Gastrointestinal:  Negative for abdominal pain, constipation,  nausea and vomiting.  Genitourinary:  Negative for difficulty urinating, dysuria, flank pain and frequency.  Musculoskeletal:  Negative for arthralgias, back pain, joint swelling and myalgias.  Skin:  Negative for color change, pallor, rash and wound.  Neurological:  Negative for dizziness, facial asymmetry, weakness, numbness and headaches.  Psychiatric/Behavioral:  Negative for behavioral problems, confusion, self-injury and suicidal ideas.       Objective:    Physical Exam Vitals and nursing note reviewed.  Constitutional:      General: He is not in acute distress.    Appearance: Normal appearance. He is not ill-appearing, toxic-appearing or diaphoretic.  Eyes:     General: No scleral icterus.       Right eye: No discharge.        Left eye: No discharge.     Extraocular Movements: Extraocular movements intact.     Conjunctiva/sclera: Conjunctivae normal.  Cardiovascular:     Rate and Rhythm: Normal rate and regular rhythm.     Pulses: Normal pulses.     Heart sounds: Normal heart sounds. No murmur heard.    No friction rub. No gallop.  Pulmonary:     Effort: Pulmonary effort is normal. No respiratory distress.     Breath sounds: Normal breath sounds. No stridor. No wheezing, rhonchi or rales.  Chest:     Chest wall: No tenderness.  Abdominal:     General: There is no distension.     Palpations: Abdomen is soft.     Tenderness: There is no abdominal tenderness. There is no right CVA tenderness, left CVA  tenderness or guarding.  Musculoskeletal:        General: No swelling, tenderness, deformity or signs of injury.     Right lower leg: No edema.     Left lower leg: No edema.  Skin:    General: Skin is warm and dry.     Capillary Refill: Capillary refill takes less than 2 seconds.     Coloration: Skin is not jaundiced or pale.     Findings: No bruising, erythema or lesion.  Neurological:     Mental Status: He is alert and oriented to person, place, and time.     Motor: No weakness.     Coordination: Coordination normal.     Gait: Gait normal.  Psychiatric:        Mood and Affect: Mood normal.        Behavior: Behavior normal.        Thought Content: Thought content normal.        Judgment: Judgment normal.     BP 138/88   Pulse 60   Ht 5' 11 (1.803 m)   Wt 206 lb (93.4 kg)   SpO2 97%   BMI 28.73 kg/m  Wt Readings from Last 3 Encounters:  04/13/24 206 lb (93.4 kg)  03/29/24 211 lb 12.8 oz (96.1 kg)  03/09/24 211 lb 12.8 oz (96.1 kg)    Lab Results  Component Value Date   TSH 2.320 03/09/2024   Lab Results  Component Value Date   WBC 6.1 03/29/2024   HGB 15.2 03/29/2024   HCT 46.8 03/29/2024   MCV 87.6 03/29/2024   PLT 278 03/29/2024   Lab Results  Component Value Date   NA 137 03/29/2024   K 4.2 03/29/2024   CO2 22 03/29/2024   GLUCOSE 111 (H) 03/29/2024   BUN 14 03/29/2024   CREATININE 0.79 03/29/2024   BILITOT <0.2 08/29/2023   ALKPHOS 95 08/29/2023  AST 22 08/29/2023   ALT 30 08/29/2023   PROT 7.4 08/29/2023   ALBUMIN 4.7 08/29/2023   CALCIUM  9.7 03/29/2024   ANIONGAP 13 03/29/2024   EGFR 100 08/29/2023   No results found for: CHOL No results found for: HDL No results found for: LDLCALC No results found for: TRIG No results found for: [facility] Lab Results  Component Value Date   HGBA1C 6.2 (A) 04/13/2024      Assessment & Plan:   Problem List Items Addressed This Visit       Cardiovascular and Mediastinum   Primary  hypertension   BP Readings from Last 3 Encounters:  04/13/24 138/88  03/29/24 (!) 128/94  03/09/24 120/60   Has not taken medication today blood pressure is elevated. On valsartan -hydrochlorothiazide  80-12.5 mg daily. - Continue valsartan -hydrochlorothiazide  80-12.5 mg daily. - Encouraged heart-healthy, low salt, low fat diet. Recommend 30 minutes of moderate vigorous exercises at least 5 days a week       Relevant Medications   valsartan -hydrochlorothiazide  (DIOVAN -HCT) 80-12.5 MG tablet   Atrial fibrillation (HCC) - Primary   Atrial fibrillation Recent episode resolved en route to hospital. Experiencing intermittent palpitations and arm spasms. - Provided cardiology office number for follow-up with Dr. Rushie. - Instructed to call cardiology office to schedule an appointment. - Continue aspirin  325 mg daily as per hospital recommendation.       Relevant Medications   valsartan -hydrochlorothiazide  (DIOVAN -HCT) 80-12.5 MG tablet     Digestive   Gastroesophageal reflux disease   Gastroesophageal reflux disease Currently taking pantoprazole  40 mg daily. - Continue pantoprazole  40 mg daily.         Endocrine   Type 2 diabetes mellitus with hyperglycemia, without long-term current use of insulin (HCC)   Lab Results  Component Value Date   HGBA1C 6.2 (A) 04/13/2024   Type 2 diabetes mellitus A1c improved from 6.8% to 6.2%. - Continue lifestyle modifications including reducing sugar, sweets, soda, bread, and rice intake. - Monitor A1c every 4-6 months. - Ordered lipid panel to screen for hyperlipidemia. - Ordered urine test to check for proteinuria. - Referred to ophthalmology for annual diabetic eye exam. - Performed annual diabetes foot exam. Counseled on low-carb diet       Relevant Medications   valsartan -hydrochlorothiazide  (DIOVAN -HCT) 80-12.5 MG tablet   Other Relevant Orders   POCT glycosylated hemoglobin (Hb A1C) (Completed)   Microalbumin / creatinine urine  ratio   Lipid panel   Ambulatory referral to Ophthalmology     Other   Depression      04/13/2024    9:51 AM 08/29/2023    9:20 AM 12/28/2022   12:29 PM  Depression screen PHQ 2/9  Decreased Interest 0 0 0  Down, Depressed, Hopeless 0 0 0  PHQ - 2 Score 0 0 0  Altered sleeping 0 3   Tired, decreased energy 0 0   Change in appetite 0 0   Feeling bad or failure about yourself  0 0   Trouble concentrating 0 0   Moving slowly or fidgety/restless 0 0   Suicidal thoughts 0 0   PHQ-9 Score 0 3    Difficult doing work/chores Not difficult at all Not difficult at all      Data saved with a previous flowsheet row definition   Currently taking sertraline  50 mg daily and hydroxyzine  25 mg as needed. - Continue sertraline  50 mg daily. - Continue hydroxyzine  25 mg as needed       Insomnia   Insomnia  Currently taking quetiapine  75mg  at bedtime.        Anxiety      04/13/2024    9:50 AM 08/29/2023    9:21 AM 12/28/2022   12:30 PM  GAD 7 : Generalized Anxiety Score  Nervous, Anxious, on Edge 0 0 1  Control/stop worrying 1 0 1  Worry too much - different things 1 1 0  Trouble relaxing 0 1 3  Restless 0 0 3  Easily annoyed or irritable 1 2 0  Afraid - awful might happen 0 0 0  Total GAD 7 Score 3 4 8   Anxiety Difficulty Not difficult at all Not difficult at all Not difficult at all   Currently taking sertraline  50 mg daily and hydroxyzine  25 mg as needed. - Continue sertraline  50 mg daily. - Continue hydroxyzine  25 mg as needed.       Hernia of abdominal wall    Reports increasing size and soreness. - Referred to surgery for evaluation and potential repair.      Relevant Orders   Ambulatory referral to General Surgery   Need for influenza vaccination   Relevant Orders   Flu vaccine trivalent PF, 6mos and older(Flulaval,Afluria,Fluarix,Fluzone) (Completed)   Muscle cramps   Probably due to prolonged standing at work Advised to take hot showers or baths to help relax  tight muscles  Stay well-hydrated      Encounter for examination following treatment at hospital   Hospital chart reviewed, including discharge summary Medications reconciled and reviewed with the patient in detail       Other Visit Diagnoses       Screening for lipid disorders       Relevant Orders   Lipid panel     Encounter for vitamin deficiency screening       Relevant Orders   VITAMIN D  25 Hydroxy (Vit-D Deficiency, Fractures)     Screening for lung cancer       Relevant Orders   CT CHEST LUNG CA SCREEN LOW DOSE W/O CM       Meds ordered this encounter  Medications   valsartan -hydrochlorothiazide  (DIOVAN -HCT) 80-12.5 MG tablet    Sig: Take 1 tablet by mouth daily.    Dispense:  90 tablet    Refill:  1    Follow-up: Return in about 4 months (around 08/11/2024).    Verina Galeno R Ozan Maclay, FNP     [1]  Allergies Allergen Reactions   Shellfish Allergy Anaphylaxis, Itching and Swelling   "

## 2024-04-13 NOTE — Assessment & Plan Note (Signed)
 Probably due to prolonged standing at work Advised to take hot showers or baths to help relax tight muscles  Stay well-hydrated

## 2024-04-13 NOTE — Assessment & Plan Note (Signed)
" °    04/13/2024    9:51 AM 08/29/2023    9:20 AM 12/28/2022   12:29 PM  Depression screen PHQ 2/9  Decreased Interest 0 0 0  Down, Depressed, Hopeless 0 0 0  PHQ - 2 Score 0 0 0  Altered sleeping 0 3   Tired, decreased energy 0 0   Change in appetite 0 0   Feeling bad or failure about yourself  0 0   Trouble concentrating 0 0   Moving slowly or fidgety/restless 0 0   Suicidal thoughts 0 0   PHQ-9 Score 0 3    Difficult doing work/chores Not difficult at all Not difficult at all      Data saved with a previous flowsheet row definition   Currently taking sertraline  50 mg daily and hydroxyzine  25 mg as needed. - Continue sertraline  50 mg daily. - Continue hydroxyzine  25 mg as needed  "

## 2024-04-13 NOTE — Assessment & Plan Note (Signed)
" °    04/13/2024    9:50 AM 08/29/2023    9:21 AM 12/28/2022   12:30 PM  GAD 7 : Generalized Anxiety Score  Nervous, Anxious, on Edge 0 0 1  Control/stop worrying 1 0 1  Worry too much - different things 1 1 0  Trouble relaxing 0 1 3  Restless 0 0 3  Easily annoyed or irritable 1 2 0  Afraid - awful might happen 0 0 0  Total GAD 7 Score 3 4 8   Anxiety Difficulty Not difficult at all Not difficult at all Not difficult at all   Currently taking sertraline  50 mg daily and hydroxyzine  25 mg as needed. - Continue sertraline  50 mg daily. - Continue hydroxyzine  25 mg as needed.  "

## 2024-04-13 NOTE — Assessment & Plan Note (Signed)
 BP Readings from Last 3 Encounters:  04/13/24 138/88  03/29/24 (!) 128/94  03/09/24 120/60   Has not taken medication today blood pressure is elevated. On valsartan -hydrochlorothiazide  80-12.5 mg daily. - Continue valsartan -hydrochlorothiazide  80-12.5 mg daily. - Encouraged heart-healthy, low salt, low fat diet. Recommend 30 minutes of moderate vigorous exercises at least 5 days a week

## 2024-04-13 NOTE — Assessment & Plan Note (Signed)
 Hospital chart reviewed, including discharge summary Medications reconciled and reviewed with the patient in detail

## 2024-04-13 NOTE — Patient Instructions (Addendum)
 [facility] [employer]. Danny Villa   [phone]   Please consider getting Shingrix and pneumonia vaccine at local pharmacy.   1. Atrial fibrillation, unspecified type (HCC) (Primary)  2. Type 2 diabetes mellitus with hyperglycemia, without long-term current use of insulin (HCC) - POCT glycosylated hemoglobin (Hb A1C) - Microalbumin / creatinine urine ratio - Lipid panel - Ambulatory referral to Ophthalmology  3. Screening for lipid disorders - Lipid panel  4. Need for influenza vaccination  5. Hernia of abdominal wall  6. Encounter for vitamin deficiency screening - VITAMIN D  25 Hydroxy (Vit-D Deficiency, Fractures)  7. Screening for lung cancer - CT CHEST LUNG CA SCREEN LOW DOSE W/O CM; Future    It is important that you exercise regularly at least 30 minutes 5 times a week as tolerated  Think about what you will eat, plan ahead. Choose  clean, green, fresh or frozen over canned, processed or packaged foods which are more sugary, salty and fatty. 70 to 75% of food eaten should be vegetables and fruit. Three meals at set times with snacks allowed between meals, but they must be fruit or vegetables. Aim to eat over a 12 hour period , example 7 am to 7 pm, and STOP after  your last meal of the day. Drink water,generally about 64 ounces per day, no other drink is as healthy. Fruit juice is best enjoyed in a healthy way, by EATING the fruit.  Thanks for choosing [facility] we consider it a privelige to serve you.

## 2024-04-14 LAB — LIPID PANEL
Chol/HDL Ratio: 8 ratio — ABNORMAL HIGH (ref 0.0–5.0)
Cholesterol, Total: 248 mg/dL — ABNORMAL HIGH (ref 100–199)
HDL: 31 mg/dL — ABNORMAL LOW
LDL Chol Calc (NIH): 183 mg/dL — ABNORMAL HIGH (ref 0–99)
Triglycerides: 180 mg/dL — ABNORMAL HIGH (ref 0–149)
VLDL Cholesterol Cal: 34 mg/dL (ref 5–40)

## 2024-04-14 LAB — MICROALBUMIN / CREATININE URINE RATIO
Creatinine, Urine: 324.3 mg/dL
Microalb/Creat Ratio: 6 mg/g{creat} (ref 0–29)
Microalbumin, Urine: 19.9 ug/mL

## 2024-04-14 LAB — VITAMIN D 25 HYDROXY (VIT D DEFICIENCY, FRACTURES): Vit D, 25-Hydroxy: 22.5 ng/mL — ABNORMAL LOW (ref 30.0–100.0)

## 2024-04-16 ENCOUNTER — Other Ambulatory Visit: Payer: Self-pay | Admitting: Nurse Practitioner

## 2024-04-16 ENCOUNTER — Ambulatory Visit: Payer: Self-pay | Admitting: Nurse Practitioner

## 2024-04-16 DIAGNOSIS — E559 Vitamin D deficiency, unspecified: Secondary | ICD-10-CM

## 2024-04-16 DIAGNOSIS — R110 Nausea: Secondary | ICD-10-CM

## 2024-04-16 DIAGNOSIS — K219 Gastro-esophageal reflux disease without esophagitis: Secondary | ICD-10-CM

## 2024-04-16 DIAGNOSIS — K439 Ventral hernia without obstruction or gangrene: Secondary | ICD-10-CM

## 2024-04-16 DIAGNOSIS — E785 Hyperlipidemia, unspecified: Secondary | ICD-10-CM

## 2024-04-16 MED ORDER — VITAMIN D3 25 MCG (1000 UT) PO CAPS: 1000.0000 [IU] | ORAL_CAPSULE | Freq: Every day | ORAL | 1 refills | Status: AC

## 2024-04-16 MED ORDER — ATORVASTATIN CALCIUM 20 MG PO TABS: 20.0000 mg | ORAL_TABLET | Freq: Every day | ORAL | 1 refills | Status: AC

## 2024-04-19 ENCOUNTER — Telehealth (HOSPITAL_BASED_OUTPATIENT_CLINIC_OR_DEPARTMENT_OTHER): Payer: Self-pay | Admitting: *Deleted

## 2024-04-19 NOTE — Telephone Encounter (Signed)
" ° °  Name: Danny Villa  DOB: 1976/02/06  MRN: [id number]  Primary Cardiologist: Joelle VEAR Ren Donley, MD  Chart reviewed as part of pre-operative protocol coverage. Because of [name]'s past medical history and time since last visit, he will require a follow-up in-office visit in order to better assess preoperative cardiovascular risk.  Follow up visit scheduled with Hao Meng, PA-C 05/07/24.   Pre-op covering staff: - Please schedule appointment and call patient to inform them. If patient already had an upcoming appointment within acceptable timeframe, please add pre-op clearance to the appointment notes so provider is aware.  Regarding ASA therapy, we recommend continuation of ASA throughout the perioperative period.  However, if the surgeon feels that cessation of ASA is required in the perioperative period, it may be stopped 5-7 days prior to surgery with a plan to resume it as soon as felt to be feasible from a surgical standpoint in the post-operative period.  [name], FNP  04/19/2024, 4:05 PM   "

## 2024-04-19 NOTE — Telephone Encounter (Signed)
"  ° °  Pre-operative Risk Assessment    Patient Name: Danny Villa  DOB: Mar 30, 1975 MRN: [id number]   Date of last office visit: 03/09/2024 Date of next office visit: 05/07/2024  Request for Surgical Clearance    Procedure:  Umbilical hernia surgery  Date of Surgery:  Clearance TBD                                 Surgeon:  Dr. Deward Foy Surgeon's Group or Practice Name:  [facility] Phone number:  [phone] Fax number:  [phone]   Type of Clearance Requested:   - Medical  - Pharmacy:  Hold Aspirin  Not Indicated   Type of Anesthesia:  General    Additional requests/questions:    Signed, Edsel Grayce Sanders   04/19/2024, 1:18 PM   "

## 2024-04-20 ENCOUNTER — Ambulatory Visit (HOSPITAL_COMMUNITY)

## 2024-04-27 ENCOUNTER — Telehealth: Payer: Self-pay

## 2024-04-27 NOTE — Telephone Encounter (Signed)
 Copied from CRM [id number]. Topic: Clinical - Request for Lab/Test Order >> Apr 27, 2024  8:31 AM Lonell PEDLAR wrote: Reason for CRM: Consuelo with [facility] called, requesting on update regarding PA for CT scan that pt has scheduled for next Wednesday, 05/02/24.   Please c/b [phone]

## 2024-05-02 ENCOUNTER — Inpatient Hospital Stay: Admission: RE | Admit: 2024-05-02 | Source: Ambulatory Visit

## 2024-05-04 ENCOUNTER — Encounter: Payer: Self-pay | Admitting: Nurse Practitioner

## 2024-05-07 ENCOUNTER — Ambulatory Visit: Admitting: Physician Assistant

## 2024-05-14 ENCOUNTER — Other Ambulatory Visit

## 2024-05-21 ENCOUNTER — Ambulatory Visit (HOSPITAL_COMMUNITY)

## 2024-06-27 ENCOUNTER — Ambulatory Visit (HOSPITAL_COMMUNITY): Admit: 2024-06-27 | Admitting: Surgery

## 2024-08-10 ENCOUNTER — Ambulatory Visit: Payer: Self-pay | Admitting: Nurse Practitioner
